# Patient Record
Sex: Male | Born: 1935 | Race: Black or African American | Hispanic: No | Marital: Married | Smoking: Former smoker
Health system: Southern US, Community
[De-identification: ages and names within clinical notes are randomized; demographics above are authoritative.]

## PROBLEM LIST (undated history)

## (undated) DIAGNOSIS — Z8546 Personal history of malignant neoplasm of prostate: Secondary | ICD-10-CM

## (undated) DIAGNOSIS — N289 Disorder of kidney and ureter, unspecified: Secondary | ICD-10-CM

## (undated) DIAGNOSIS — C801 Malignant (primary) neoplasm, unspecified: Secondary | ICD-10-CM

## (undated) DIAGNOSIS — I10 Essential (primary) hypertension: Secondary | ICD-10-CM

## (undated) DIAGNOSIS — Z87438 Personal history of other diseases of male genital organs: Secondary | ICD-10-CM

## (undated) DIAGNOSIS — B351 Tinea unguium: Secondary | ICD-10-CM

## (undated) DIAGNOSIS — Z8719 Personal history of other diseases of the digestive system: Secondary | ICD-10-CM

## (undated) DIAGNOSIS — F0390 Unspecified dementia without behavioral disturbance: Secondary | ICD-10-CM

## (undated) DIAGNOSIS — I509 Heart failure, unspecified: Secondary | ICD-10-CM

## (undated) DIAGNOSIS — E785 Hyperlipidemia, unspecified: Secondary | ICD-10-CM

## (undated) HISTORY — DX: Tinea unguium: B35.1

---

## 1998-03-02 DIAGNOSIS — Z8719 Personal history of other diseases of the digestive system: Secondary | ICD-10-CM

## 1998-03-02 HISTORY — DX: Personal history of other diseases of the digestive system: Z87.19

## 1998-06-04 ENCOUNTER — Inpatient Hospital Stay (HOSPITAL_COMMUNITY): Admission: AD | Admit: 1998-06-04 | Discharge: 1998-06-07 | Payer: Self-pay | Admitting: Internal Medicine

## 1998-06-06 ENCOUNTER — Encounter: Payer: Self-pay | Admitting: Gastroenterology

## 1998-06-06 ENCOUNTER — Encounter (HOSPITAL_BASED_OUTPATIENT_CLINIC_OR_DEPARTMENT_OTHER): Payer: Self-pay | Admitting: Internal Medicine

## 2001-03-02 DIAGNOSIS — Z8546 Personal history of malignant neoplasm of prostate: Secondary | ICD-10-CM

## 2001-03-02 HISTORY — DX: Personal history of malignant neoplasm of prostate: Z85.46

## 2001-04-24 ENCOUNTER — Ambulatory Visit: Admission: RE | Admit: 2001-04-24 | Discharge: 2001-07-23 | Payer: Self-pay | Admitting: Radiation Oncology

## 2001-04-27 ENCOUNTER — Ambulatory Visit (HOSPITAL_COMMUNITY): Admission: RE | Admit: 2001-04-27 | Discharge: 2001-04-27 | Payer: Self-pay | Admitting: Urology

## 2001-04-27 ENCOUNTER — Encounter: Payer: Self-pay | Admitting: Urology

## 2001-08-02 ENCOUNTER — Ambulatory Visit (HOSPITAL_COMMUNITY): Admission: RE | Admit: 2001-08-02 | Discharge: 2001-08-02 | Payer: Self-pay | Admitting: Urology

## 2001-08-02 ENCOUNTER — Encounter: Payer: Self-pay | Admitting: Urology

## 2001-08-09 ENCOUNTER — Ambulatory Visit: Admission: RE | Admit: 2001-08-09 | Discharge: 2001-11-07 | Payer: Self-pay | Admitting: Radiation Oncology

## 2001-12-05 ENCOUNTER — Ambulatory Visit: Admission: RE | Admit: 2001-12-05 | Discharge: 2002-02-09 | Payer: Self-pay | Admitting: Radiation Oncology

## 2002-03-13 ENCOUNTER — Ambulatory Visit: Admission: RE | Admit: 2002-03-13 | Discharge: 2002-03-13 | Payer: Self-pay | Admitting: Radiation Oncology

## 2003-03-19 ENCOUNTER — Ambulatory Visit: Admission: RE | Admit: 2003-03-19 | Discharge: 2003-03-19 | Payer: Self-pay | Admitting: Radiation Oncology

## 2004-03-27 ENCOUNTER — Ambulatory Visit: Admission: RE | Admit: 2004-03-27 | Discharge: 2004-06-25 | Payer: Self-pay | Admitting: Radiation Oncology

## 2010-06-29 ENCOUNTER — Emergency Department (HOSPITAL_COMMUNITY): Payer: Medicare Other

## 2010-06-29 ENCOUNTER — Inpatient Hospital Stay (HOSPITAL_COMMUNITY)
Admission: EM | Admit: 2010-06-29 | Discharge: 2010-06-30 | DRG: 312 | Disposition: A | Discharge status: Home Health | Payer: Medicare Other | Attending: Internal Medicine | Admitting: Internal Medicine

## 2010-06-29 DIAGNOSIS — Z7982 Long term (current) use of aspirin: Secondary | ICD-10-CM

## 2010-06-29 DIAGNOSIS — N400 Enlarged prostate without lower urinary tract symptoms: Secondary | ICD-10-CM | POA: Diagnosis present

## 2010-06-29 DIAGNOSIS — E875 Hyperkalemia: Secondary | ICD-10-CM | POA: Diagnosis present

## 2010-06-29 DIAGNOSIS — F0390 Unspecified dementia without behavioral disturbance: Secondary | ICD-10-CM | POA: Diagnosis present

## 2010-06-29 DIAGNOSIS — R55 Syncope and collapse: Principal | ICD-10-CM | POA: Diagnosis present

## 2010-06-29 DIAGNOSIS — N183 Chronic kidney disease, stage 3 unspecified: Secondary | ICD-10-CM | POA: Diagnosis present

## 2010-06-29 DIAGNOSIS — I5030 Unspecified diastolic (congestive) heart failure: Secondary | ICD-10-CM | POA: Diagnosis present

## 2010-06-29 DIAGNOSIS — Z8546 Personal history of malignant neoplasm of prostate: Secondary | ICD-10-CM

## 2010-06-29 DIAGNOSIS — I509 Heart failure, unspecified: Secondary | ICD-10-CM | POA: Diagnosis present

## 2010-06-29 LAB — DIFFERENTIAL
Eosinophils Relative: 3 % (ref 0–5)
Lymphocytes Relative: 19 % (ref 12–46)
Lymphs Abs: 1.1 10*3/uL (ref 0.7–4.0)
Monocytes Absolute: 0.5 10*3/uL (ref 0.1–1.0)
Monocytes Relative: 8 % (ref 3–12)

## 2010-06-29 LAB — CARDIAC PANEL(CRET KIN+CKTOT+MB+TROPI)
Relative Index: 1.3 (ref 0.0–2.5)
Troponin I: 0.01 ng/mL (ref 0.00–0.06)

## 2010-06-29 LAB — URINALYSIS, ROUTINE W REFLEX MICROSCOPIC
Hgb urine dipstick: NEGATIVE
Specific Gravity, Urine: 1.021 (ref 1.005–1.030)
Urobilinogen, UA: 1 mg/dL (ref 0.0–1.0)

## 2010-06-29 LAB — POCT I-STAT, CHEM 8
Calcium, Ion: 1.15 mmol/L (ref 1.12–1.32)
Glucose, Bld: 96 mg/dL (ref 70–99)
HCT: 38 % — ABNORMAL LOW (ref 39.0–52.0)
Hemoglobin: 12.9 g/dL — ABNORMAL LOW (ref 13.0–17.0)

## 2010-06-29 LAB — CK TOTAL AND CKMB (NOT AT ARMC): CK, MB: 1.5 ng/mL (ref 0.3–4.0)

## 2010-06-29 LAB — GLUCOSE, CAPILLARY: Glucose-Capillary: 97 mg/dL (ref 70–99)

## 2010-06-29 LAB — BRAIN NATRIURETIC PEPTIDE: Pro B Natriuretic peptide (BNP): <30 pg/mL (ref 0.0–100.0)

## 2010-06-29 LAB — CBC
HCT: 36.5 % — ABNORMAL LOW (ref 39.0–52.0)
MCHC: 32.3 g/dL (ref 30.0–36.0)
MCV: 82 fL (ref 78.0–100.0)
RDW: 18.4 % — ABNORMAL HIGH (ref 11.5–15.5)
WBC: 6.2 10*3/uL (ref 4.0–10.5)

## 2010-06-30 DIAGNOSIS — R55 Syncope and collapse: Secondary | ICD-10-CM

## 2010-06-30 LAB — CBC
HCT: 36.2 % — ABNORMAL LOW (ref 39.0–52.0)
Hemoglobin: 11.7 g/dL — ABNORMAL LOW (ref 13.0–17.0)
MCHC: 32.3 g/dL (ref 30.0–36.0)
RBC: 4.46 MIL/uL (ref 4.22–5.81)
WBC: 5.3 10*3/uL (ref 4.0–10.5)

## 2010-06-30 LAB — BASIC METABOLIC PANEL
CO2: 27 mEq/L (ref 19–32)
Chloride: 104 mEq/L (ref 96–112)
Glucose, Bld: 90 mg/dL (ref 70–99)
Potassium: 4.2 mEq/L (ref 3.5–5.1)
Sodium: 138 mEq/L (ref 135–145)

## 2010-06-30 LAB — CARDIAC PANEL(CRET KIN+CKTOT+MB+TROPI)
CK, MB: 1.4 ng/mL (ref 0.3–4.0)
Relative Index: 1.1 (ref 0.0–2.5)
Relative Index: 1.2 (ref 0.0–2.5)

## 2010-07-01 NOTE — H&P (Signed)
NAME:  Cody [name], Cody [name]             ACCOUNT NO.:  [id number]  MEDICAL RECORD NO.:  [id number]           PATIENT TYPE:  I  LOCATION:  3708                         FACILITY:  [facility]  PHYSICIAN:  Kari Baars, M.D.  DATE OF BIRTH:  September 01, 1927  DATE OF ADMISSION:  06/29/2010 DATE OF DISCHARGE:                             HISTORY & PHYSICAL   CHIEF COMPLAINT:  Lightheadedness.  HISTORY OF PRESENT ILLNESS:  Cody [name] is an 77 year old African American male with a history of dilated cardiomyopathy/congestive heart failure secondary to systolic dysfunction (ejection fraction 30%), hypertension, and hyperlipidemia who presented to the emergency department with a complaint of lightheadedness.  The patient's history may be limited bit by his cognitive impairment, but he reports that he was in his usual state of health this morning.  He ate his breakfast with minimal fluid intake and went to church.  While standing forprayer, he became lightheaded and nearly passed out.  He was helped to a wheelchair by his parishioners and slumped down, but did not have loss of consciousness.  He denied any chest pain or palpitations.  EMS was called and he was brought to the emergency department where evaluation is negative thus far.  He denies any prior history of syncope or near syncope.  He does have an active lifestyle stating that he plays golf frequently and has been doing yard work without any chest pain, shortness of breath, dyspnea on exertion.  He denies orthopnea or edema. He denies any associated neurologic symptoms with this episode or recently.  He does not recall seeing cardiologist in the past.  We did have a record in our chart of an echocardiogram in 2003 by Dr. Donnie Aho that showed an ejection fraction of 30%.  There was no evidence of any cardiac followup since that time.  REVIEW OF SYSTEMS:  All systems were reviewed with the patient are negative except in HPI.  PAST MEDICAL  HISTORY: 1. Dilated cardiomyopathy/congestive heart failure secondary to     systolic dysfunction (ejection fraction 30-35%) with global     hypokinesis in June 2003. 2. Hypertension. 3. Hyperlipidemia. 4. Chronic renal insufficiency, stage III with baseline creatinine of     around 1.2. 5. BPH. 6. History of prostate cancer (diagnosed in 2003), status post     external radiation therapy. 7. History of lower GI bleed (April 2000). 8. Mild cognitive impairment/dementia.  CURRENT MEDICATIONS: 1. Amlodipine 5 mg daily. 2. Aspirin 81 mg daily. 3. Coreg 25 mg b.i.d. 4. Doxazosin 4 mg daily. 5. Aricept 5 mg nightly. 6. Ferrex 150 mg daily.  ALLERGIES: 1. LIPITOR caused myalgias in the past. 2. ACE INHIBITOR caused hyperkalemia.  SOCIAL HISTORY:  He is married, has no children.  He is retired from [employer].  He is a remote smoker of less than four-pack year history.  He quit in the 1970s.  Denies alcohol use.  FAMILY HISTORY:  Father died at the age of 14 due to complications from appendicitis.  Mother died at multiple myeloma at 18 and he has a brother who had prostate cancer.  PHYSICAL EXAMINATION:  VITAL SIGNS:  Temperature 97.7, blood  pressure lying 115/64 with a heart rate of 60, sitting 118/79 with a pulse of 66, and standing 102/58 with a pulse of 73, oxygen saturation 99% on room air. GENERAL:  Articulate African American gentleman with some apparent memory loss. HEENT:  Oropharynx is moist.  No scleral icterus. NECK:  Supple without lymphadenopathy, JVD, or carotid bruits. HEART:  Regular rate and rhythm without murmurs, rubs, or gallops.  I do not appreciate an S3 or S4. LUNGS:  Clear to auscultation bilaterally. ABDOMEN:  Soft, nondistended, nontender with normoactive bowel sounds. EXTREMITIES:  No clubbing, cyanosis or edema. NEUROLOGIC:  Alert and oriented to situation and place.  Motor strength is 5/5 in all extremities.  No finger-to-nose ataxia.  No  focal neurologic deficits.  LABORATORY STUDIES:  CBC shows a white count of 6.2, hemoglobin 11.8, platelets 652.  BMET significant for sodium 139, potassium 4.9, chloride 103, bicarb 26, BUN 17, creatinine 1.5, glucose 96.  Urinalysis is negative.  Cardiac enzymes are negative x1 with a troponin of less than 0.05.  STUDIES: 1. EKG personally reviewed shows normal sinus rhythm with normal EKG. 2. Chest x-ray shows no acute cardiopulmonary disease (personally     reviewed).  ASSESSMENT/PLAN: 1. Near syncope - he will be admitted for further evaluation and     treatment.  He was orthostatic upon standing, which may be the     culprit and maybe related to his doxazosin use and mild volume     depletion as evidenced by his elevated BUN and creatinine; however,     given his prior dilated cardiomyopathy with the depressed ejection     fraction (30% in 2003).  He will need a telemetry monitoring for     any associated arrhythmia and a repeat echocardiogram.  We will     rule out myocardial infarction with serial enzymes.  Consider     Cardiology consult pending echo results.  We will obtain carotid     Dopplers and transcranial Dopplers as well.  We will repeat his     orthostasis in the morning after some gentle hydration overnight. 2. Congestive heart failure secondary to diastolic dysfunction     (ejection fraction 30% in 2003).  We will continue his beta-blocker     therapy.  He is not a candidate for an ACE inhibitor or ARB     secondary to his prior history of hyperkalemia.  He appears well     compensated at this time.  We will obtain a BNP. 3. Chronic renal insufficiency, stage III - his baseline creatinine is     around 1.2 in August 2011.  It is 1.5 today.  We will monitor with     hydration as he may be slightly volume depleted. 4. Benign prostatic hypertrophy/history of prostate cancer - we will     hold his doxazosin for now given his     orthostasis.  We will defer to Dr.  Eloise Harman whether it is best to     resume this given his prior history. 5. Disposition - anticipate possible discharge in 1-2 days pending     workup. 6. Deep venous thrombosis prophylaxis with Lovenox.     Kari Baars, M.D.     WS/MEDQ  D:  06/29/2010  T:  06/30/2010  Job:  [id number]  cc:   Barry Dienes. Eloise Harman, M.D. Georga Hacking, M.D.  Electronically Signed by Lacretia Nicks. Buren Kos M.D. on 07/01/2010 09:11:43 PM

## 2010-07-16 NOTE — Discharge Summary (Signed)
NAME:  Cody [name], Cody [name]             ACCOUNT NO.:  [id number]  MEDICAL RECORD NO.:  [id number]           PATIENT TYPE:  I  LOCATION:  3708                         FACILITY:  [facility]  PHYSICIAN:  Barry Dienes. Eloise Harman, M.D.DATE OF BIRTH:  1927/10/23  DATE OF ADMISSION:  06/29/2010 DATE OF DISCHARGE:  06/30/2010                              DISCHARGE SUMMARY   PERTINENT FINDINGS:  The patient is an 77 year old African-American man with a history of dilated cardiomyopathy and congestive heart failure secondary to idiopathic systolic dysfunction with a left ventricular ejection fraction of approximately 30%.  He also has a history of hypertension and hyperlipidemia and presented to the Emergency Department with a complaint of lightheadedness.  His history details were somewhat limited because of Alzheimer's disease.  He reports that he was in his usual state of good health on the morning of admission. He ate breakfast and went to church.  While standing for prayer, he became lightheaded and nearly passed out.  He was helped to a wheelchair by his parishioners and slumped down, but did not actually have a loss of consciousness.  He denied associated chest pain or palpitations.  9-1- 1 was called and he was brought to the emergency room.  He has no history of syncope or near-syncope.  He has no history of orthopnea or dyspnea on exertion.  He also denied associated neurologic symptoms with this near-syncope or incontinence.  PAST MEDICAL HISTORY:  Idiopathic dilated cardiomyopathy with June 2003 left ventricular ejection fraction of 30% to 35% and a Cardiolite exercise test not showing evidence of ischemia.  Hypertension, hyperlipidemia.  Chronic renal insufficiency, stage 3, with baseline serum creatinine level of 1.2.  Benign prostatic hypertrophy.  Prostate cancer diagnosed in 2003 status post external beam therapy.  Lower GI bleed in April 2000 and mild Alzheimer's disease.  MEDICATIONS  ON ADMISSION: 1. Amlodipine 5 mg p.o. daily. 2. Aspirin 81 mg p.o. daily. 3. Coreg 25 mg p.o. twice daily. 4. Doxazosin 4 mg p.o. daily. 5. Aricept 5 mg p.o. every p.m. 6. Ferrex 150 mg p.o. daily.  ALLERGIES:  LIPITOR has been associated with myalgias and ACE INHIBITORS have caused hyperkalemia.  SOCIAL HISTORY:  He is married and has no children.  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  He has a remote smoking history of less than four pack years.  This quit in the 1970s and he has no history of alcohol abuse.  INITIAL PHYSICAL EXAMINATION:  VITAL SIGNS:  Blood pressure 115/64 with a pulse of 60, respirations 20, pulse oxygen saturation 99% on room air and temperature 97.7. GENERAL:  The patient is an elderly African-American man who is in no apparent distress. HEAD, EYES, EARS, NOSE AND THROAT:  Examination was within normal limits. NECK:  Without jugular venous distention or carotid bruit. CHEST:  Clear to auscultation. HEART:  Regular rate and rhythm without significant murmur or gallop. ABDOMEN:  Benign. EXTREMITIES:  Without cyanosis, clubbing, or edema. NEUROLOGIC EXAMINATION:  The patient was alert and oriented to situation and place.  Motor strength was 5/5 in all extremities.  He had normal bilateral finger-to-nose testing and  normal muscle strength.  INITIAL LABORATORY STUDIES:  White blood cell count was 6.2 with a hemoglobin of 11.8, platelets 652.  Serum sodium was 139, potassium 4.9, chloride 103, carbon dioxide 26, BUN 17, creatinine 1.5, glucose 96. Urinalysis was normal.  Cardiac enzymes were normal.  RADIOLOGIC: 1. EKG showed the following:     a.     Normal sinus rhythm.     b.     No abnormalities. 2. A chest x-ray showed no acute cardiopulmonary disease.  HOSPITAL COURSE:  As the patient was slightly orthostatic on admission, he was treated with IV fluids and he had continuous telemetry.  His doxazosin was put on hold as it could  contribute to hypotension and near- fainting.  He had a carotid ultrasound examination that showed no evidence of internal carotid artery stenosis and antegrade vertebral artery flow.  For some reason an echocardiogram was not done.  The patient returned to his room from his carotid ultrasound exam, pulled out his IV, and requested to go home.  PROCEDURES:  Carotid ultrasound examination and EKG telemetry.  COMPLICATIONS:  None.  CONDITION ON DISCHARGE:  His vital signs were stable and he was afebrile.  He was able to walk without any difficulty.  He was not having any shortness of breath or chest discomfort or palpitations.  PHYSICAL EXAMINATION ON THE DAY OF HOSPITAL DISCHARGE:  VITAL SIGNS: Blood pressure 132/71, pulse 65, respirations 18, temperature 97.7, pulse oxygen saturation was 97% on room air.  GENERAL:  He is an elderly black man who was in no apparent distress. CHEST:  Clear to auscultation. HEART:  Regular rate and rhythm. ABDOMEN:  Benign. EXTREMITIES:  Without edema. NEUROLOGIC:  He was alert and could give a good interval history.  He was able to walk without difficulty.  LABORATORY DATA:  Most recent laboratory studies included a white blood cell count of 5.3, hemoglobin 11.7, hematocrit 36, platelets 698, BNP 30.  DISCHARGE DIAGNOSES: 1. Near-syncope. 2. Idiopathic dilated cardiomyopathy. 3. Hypertension, essential, controlled. 4. Benign prostatic hypertrophy. 5. History of prostate adenocarcinoma. 6. Thrombocytosis. 7. Mild Alzheimer's disease.  DISCHARGE MEDICATIONS: 1. Amlodipine 5 mg p.o. daily. 2. Aspirin 81 mg p.o. daily. 3. Carvedilol 25 mg p.o. twice daily. 4. Donepezil 5 mg p.o. every p.m. 5. Ferrex 150 mg p.o. daily. 6. He was advised to discontinue Cardura.  FOLLOWUP PLANS:  He was advised to call Dr. Jarome Matin at [phone] to schedule a followup visit within the next two weeks.          ______________________________ Barry Dienes  Eloise Harman, M.D.     DGP/MEDQ  D:  07/15/2010  T:  07/15/2010  Job:  [id number]  Electronically Signed by Jarome Matin M.D. on 07/16/2010 08:19:33 AM

## 2010-11-03 ENCOUNTER — Other Ambulatory Visit: Payer: Self-pay | Admitting: Gastroenterology

## 2012-04-21 ENCOUNTER — Emergency Department (HOSPITAL_COMMUNITY)
Admission: EM | Admit: 2012-04-21 | Discharge: 2012-04-21 | Disposition: A | Discharge status: Home / Self Care | Payer: Medicare Other | Attending: Emergency Medicine | Admitting: Emergency Medicine

## 2012-04-21 ENCOUNTER — Encounter (HOSPITAL_COMMUNITY): Payer: Self-pay | Admitting: Emergency Medicine

## 2012-04-21 DIAGNOSIS — F0280 Dementia in other diseases classified elsewhere without behavioral disturbance: Secondary | ICD-10-CM | POA: Insufficient documentation

## 2012-04-21 DIAGNOSIS — Y9210 Unspecified residential institution as the place of occurrence of the external cause: Secondary | ICD-10-CM | POA: Insufficient documentation

## 2012-04-21 DIAGNOSIS — Z043 Encounter for examination and observation following other accident: Secondary | ICD-10-CM | POA: Insufficient documentation

## 2012-04-21 DIAGNOSIS — G309 Alzheimer's disease, unspecified: Secondary | ICD-10-CM | POA: Insufficient documentation

## 2012-04-21 DIAGNOSIS — W19XXXA Unspecified fall, initial encounter: Secondary | ICD-10-CM

## 2012-04-21 DIAGNOSIS — Y939 Activity, unspecified: Secondary | ICD-10-CM | POA: Insufficient documentation

## 2012-04-21 DIAGNOSIS — Z79899 Other long term (current) drug therapy: Secondary | ICD-10-CM | POA: Insufficient documentation

## 2012-04-21 DIAGNOSIS — W07XXXA Fall from chair, initial encounter: Secondary | ICD-10-CM | POA: Insufficient documentation

## 2012-04-21 HISTORY — DX: Unspecified dementia, unspecified severity, without behavioral disturbance, psychotic disturbance, mood disturbance, and anxiety: F03.90

## 2012-04-21 NOTE — ED Provider Notes (Signed)
Elderly black male who is extremely active and trying to get off the stretcher. He does not appear to have any pain, he is using arms and legs well without difficulty.   Medical screening examination/treatment/procedure(s) were conducted as a shared visit with non-physician practitioner(s) and myself.  I personally evaluated the patient during the encounter  Devoria Albe, MD, Franz Dell, MD 04/21/12 [phone]

## 2012-04-21 NOTE — ED Provider Notes (Signed)
See prior note   Ward Givens, MD 04/21/12 7846

## 2012-04-21 NOTE — ED Notes (Signed)
Per EMS pt at nursing home facility when he fell asleep in chair and fell on left side. Pt denies any pain. No obvious injuries.

## 2012-04-21 NOTE — ED Provider Notes (Signed)
History     CSN: [id number]  Arrival date & time 04/21/12  1454   First MD Initiated Contact with Patient 04/21/12 1521      Chief Complaint  Patient presents with  . Fall    (Consider location/radiation/quality/duration/timing/severity/associated sxs/prior treatment) HPI Patient presents from a nursing home following a fall occurred just prior to arrival.  The patient is unable to give me any history do to Alzheimer's and dementia.  Patient does not have any signs of major trauma. Past Medical History  Diagnosis Date  . Dementia     No past surgical history on file.  No family history on file.  History  Substance Use Topics  . Smoking status: Not on file  . Smokeless tobacco: Not on file  . Alcohol Use: Not on file      Review of Systems Level V caveat due to dementia and Alzheimer Allergies  Review of patient's allergies indicates not on file.  Home Medications   Current Outpatient Rx  Name  Route  Sig  Dispense  Refill  . carvedilol (COREG) 25 MG tablet   Oral   Take 25 mg by mouth 2 (two) times daily with a meal.         . divalproex (DEPAKOTE) 125 MG DR tablet   Oral   Take 125 mg by mouth 2 (two) times daily.         . divalproex (DEPAKOTE) 125 MG DR tablet   Oral   Take 375 mg by mouth at bedtime.         . donepezil (ARICEPT) 5 MG tablet   Oral   Take 5 mg by mouth at bedtime as needed.         Marland Kitchen OLANZapine (ZYPREXA) 5 MG tablet   Oral   Take 5 mg by mouth daily.         . traZODone (DESYREL) 50 MG tablet   Oral   Take 50 mg by mouth at bedtime.         Marland Kitchen zolpidem (AMBIEN) 5 MG tablet   Oral   Take 5 mg by mouth at bedtime as needed for sleep.           BP 144/80  Pulse 86  Temp(Src) 98.2 F (36.8 C) (Oral)  Resp 18  SpO2 98%  Physical Exam  Constitutional: He appears well-developed and well-nourished. No distress.  HENT:  Head: Normocephalic and atraumatic.  Mouth/Throat: Oropharynx is clear and moist.  Eyes:  Pupils are equal, round, and reactive to light.  Neck: Normal range of motion. Neck supple.  Cardiovascular: Normal rate, regular rhythm and normal heart sounds.   Pulmonary/Chest: Effort normal and breath sounds normal.  Musculoskeletal:  Patient does not have any pain on my exam of all 4 extremities.  Patient is moving all 4 extremities without difficulty and is attempting to get out of bed  Neurological: He is alert. Coordination normal.  Skin: Skin is warm and dry.    ED Course  Procedures (including critical care time)  Patient does not have any signs of trauma or significant injury on exam.  We'll have patient followup with his primary Dr. for recheck.  Told to return to the emergency department for any worsening in his condition   MDM  MDM Reviewed: nursing note and vitals           Carlyle Dolly, PA-C 04/21/12 1737

## 2012-04-26 ENCOUNTER — Emergency Department (HOSPITAL_COMMUNITY)
Admission: EM | Admit: 2012-04-26 | Discharge: 2012-04-26 | Disposition: A | Discharge status: Skilled Nursing Facility | Payer: Medicare Other | Attending: Emergency Medicine | Admitting: Emergency Medicine

## 2012-04-26 ENCOUNTER — Emergency Department (HOSPITAL_COMMUNITY): Payer: Medicare Other

## 2012-04-26 DIAGNOSIS — Y9210 Unspecified residential institution as the place of occurrence of the external cause: Secondary | ICD-10-CM | POA: Insufficient documentation

## 2012-04-26 DIAGNOSIS — S46909A Unspecified injury of unspecified muscle, fascia and tendon at shoulder and upper arm level, unspecified arm, initial encounter: Secondary | ICD-10-CM | POA: Insufficient documentation

## 2012-04-26 DIAGNOSIS — Y939 Activity, unspecified: Secondary | ICD-10-CM | POA: Insufficient documentation

## 2012-04-26 DIAGNOSIS — F0390 Unspecified dementia without behavioral disturbance: Secondary | ICD-10-CM | POA: Insufficient documentation

## 2012-04-26 DIAGNOSIS — Z79899 Other long term (current) drug therapy: Secondary | ICD-10-CM | POA: Insufficient documentation

## 2012-04-26 DIAGNOSIS — W19XXXA Unspecified fall, initial encounter: Secondary | ICD-10-CM | POA: Insufficient documentation

## 2012-04-26 NOTE — ED Provider Notes (Addendum)
History     CSN: [id number]  Arrival date & time 04/26/12  1341   First MD Initiated Contact with Patient 04/26/12 1411      Chief Complaint  Patient presents with  . Fall   Level V caveat for dementia  (Consider location/radiation/quality/duration/timing/severity/associated sxs/prior treatment) HPI  Patient lives in a nursing home. Evidently he fell today at an per their staff landed on his right side. They report he was complaining of pain in his right shoulder. Patient denies any pain to me however. Patient's sister and wife appeared during my exam. Patient does appear to recognize his wife.   PCP Dr Nyra Capes  Past Medical History  Diagnosis Date  . Dementia     No past surgical history on file.  No family history on file.  History  Substance Use Topics  . Smoking status: No  . Smokeless tobacco: Not on file  . Alcohol Use: No   Lives in a nursing home   Review of Systems  Unable to perform ROS: Dementia    Allergies  Ace inhibitors  Home Medications   Current Outpatient Rx  Name  Route  Sig  Dispense  Refill  . carvedilol (COREG) 25 MG tablet   Oral   Take 25 mg by mouth 2 (two) times daily with a meal.         . clonazePAM (KLONOPIN) 0.5 MG tablet   Oral   Take 0.5 mg by mouth 2 (two) times daily as needed for anxiety.         . divalproex (DEPAKOTE) 125 MG DR tablet   Oral   Take 125 mg by mouth every morning.          . divalproex (DEPAKOTE) 125 MG DR tablet   Oral   Take 375 mg by mouth at bedtime.         . donepezil (ARICEPT) 10 MG tablet   Oral   Take 10 mg by mouth at bedtime.         . temazepam (RESTORIL) 15 MG capsule   Oral   Take 30 mg by mouth at bedtime.         Marland Kitchen zolpidem (AMBIEN) 5 MG tablet   Oral   Take 5 mg by mouth at bedtime as needed for sleep.           BP 142/74  Pulse 69  Temp(Src) 98.3 F (36.8 C) (Oral)  Resp 18  SpO2 100%  Vital signs normal    Physical Exam  Nursing note and  vitals reviewed. Constitutional: He is oriented to person, place, and time. He appears well-developed and well-nourished.  Non-toxic appearance. He does not appear ill. No distress.  HENT:  Head: Normocephalic and atraumatic.  Right Ear: External ear normal.  Left Ear: External ear normal.  Nose: Nose normal. No mucosal edema or rhinorrhea.  Mouth/Throat: Oropharynx is clear and moist and mucous membranes are normal. No dental abscesses or edematous.  Head nontender to palpation, no evidence of trauma seen  Eyes: Conjunctivae and EOM are normal. Pupils are equal, round, and reactive to light.  Neck: Normal range of motion and full passive range of motion without pain. Neck supple.  Cardiovascular: Normal rate, regular rhythm and normal heart sounds.  Exam reveals no gallop and no friction rub.   No murmur heard. Pulmonary/Chest: Effort normal and breath sounds normal. No respiratory distress. He has no wheezes. He has no rhonchi. He has no rales. He exhibits no  tenderness and no crepitus.  Ribs are nontender  Abdominal: Soft. Normal appearance and bowel sounds are normal. He exhibits no distension. There is no tenderness. There is no rebound and no guarding.  Musculoskeletal: Normal range of motion. He exhibits no edema and no tenderness.  Moves all extremities well. Patient moves his right upper arm without any apparent difficulty. There is no deformity, there is no tenderness to palpation or tenderness to the clavicle. He also moves his left upper arm freely. He flexes his left knee well without apparent pain in his knee or his hip. He seems to be reluctant to flex his right knee and seems to have some discomfort in his right hip.  Neurological: He is alert and oriented to person, place, and time. He has normal strength. No cranial nerve deficit.  Skin: Skin is warm, dry and intact. No rash noted. No erythema. No pallor.  Psychiatric: He has a normal mood and affect. His speech is normal and  behavior is normal. His mood appears not anxious.    ED Course  Procedures (including critical care time)  Family want a head CT to be done. Pt has no evidence of injury to his head and the NH was not concerned about hitting his head.   I tended to help nursing staff stand patient up. He got angry because he said my hands were cold. He said he had pain but could not tell me where his pain was. I will order CT scan of his hip to make sure there is no occult fracture.   Pt was able to get up and walk with assistance and no complaints of pain. Will cancel CT scan and discharge.   Dg Shoulder Right  04/26/2012  *RADIOLOGY REPORT*  Clinical Data: Pain post trauma  RIGHT SHOULDER - 2+ VIEW  Comparison: None.  Findings: Internal rotation, external rotation, and Y scapular views were obtained.    There is no appreciable fracture or dislocation.  There is mild generalized osteoarthritic change.  No erosive change or intra-articular calcifications.  IMPRESSION: Osteoarthritic change.  No fracture or dislocation.   Original Report Authenticated By: Bretta Bang, M.D.    Dg Hip Complete Right  04/26/2012  *RADIOLOGY REPORT*  Clinical Data: Fall with right hip pain.  RIGHT HIP - COMPLETE 2+ VIEW  Comparison: None.  Findings: There is no evidence of acute fracture or dislocation. Mild degenerative changes are present in the right hip joint. There is some irregularity involving the inferior pubic ramus which, based on appearance, may be related to an old injury. Correlation suggested with any focal tenderness as there may be a subtle fracture.  IMPRESSION: No evidence of acute fracture.  Irregularity along the undersurface of the inferior pubic ramus may relate to prior injury or subtle acute fracture.   Original Report Authenticated By: Irish Lack, M.D.      1. Fall, initial encounter     Plan discharge  Devoria Albe, MD, Concha Pyo, MD, FACEP   MDM          Ward Givens,  MD 04/26/12 1610  Ward Givens, MD 04/26/12 1622

## 2012-04-26 NOTE — ED Notes (Signed)
Pt ambulated 40 feet with assistance

## 2012-04-26 NOTE — ED Notes (Signed)
ZOX:[id number]<AV> Expected date:04/26/12<BR> Expected time: 1:30 PM<BR> Means of arrival:Ambulance<BR> Comments:<BR> 69yoM/fall

## 2012-04-26 NOTE — ED Notes (Signed)
Per EMS [facility], per EMS staff reports pt fell from standing position, landing on his bottom and then rolling on his right side. No LOC, pt c/o right shoulder pain only.

## 2012-04-26 NOTE — Progress Notes (Signed)
ED CM called by male family member to pt room to assist when pt attempting to put on shoes and get out of bed. CM and CN assisted in getting pt back to bed and changed soiled depends.  Family spoke with Cm about pt's dementia.  Family inquired about CT scan mentioned by POA, niece. Dr Lynelle Doctor spoke with family about imaging ordered pcp is daniel paterson EP[facility] updated

## 2012-04-26 NOTE — ED Notes (Signed)
Pt comes to ed with c/o fall, pt reports pain in his right shoulder. Pt has hx of dementia.

## 2012-05-31 ENCOUNTER — Emergency Department (HOSPITAL_COMMUNITY): Payer: Medicare Other

## 2012-05-31 ENCOUNTER — Encounter (HOSPITAL_COMMUNITY): Payer: Self-pay | Admitting: *Deleted

## 2012-05-31 ENCOUNTER — Emergency Department (HOSPITAL_COMMUNITY)
Admission: EM | Admit: 2012-05-31 | Discharge: 2012-05-31 | Disposition: A | Discharge status: Skilled Nursing Facility | Payer: Medicare Other | Attending: Emergency Medicine | Admitting: Emergency Medicine

## 2012-05-31 DIAGNOSIS — F0390 Unspecified dementia without behavioral disturbance: Secondary | ICD-10-CM | POA: Insufficient documentation

## 2012-05-31 DIAGNOSIS — IMO0002 Reserved for concepts with insufficient information to code with codable children: Secondary | ICD-10-CM | POA: Insufficient documentation

## 2012-05-31 DIAGNOSIS — S0990XA Unspecified injury of head, initial encounter: Secondary | ICD-10-CM

## 2012-05-31 DIAGNOSIS — Y9210 Unspecified residential institution as the place of occurrence of the external cause: Secondary | ICD-10-CM | POA: Insufficient documentation

## 2012-05-31 DIAGNOSIS — Z79899 Other long term (current) drug therapy: Secondary | ICD-10-CM | POA: Insufficient documentation

## 2012-05-31 DIAGNOSIS — Z859 Personal history of malignant neoplasm, unspecified: Secondary | ICD-10-CM | POA: Insufficient documentation

## 2012-05-31 DIAGNOSIS — I10 Essential (primary) hypertension: Secondary | ICD-10-CM | POA: Insufficient documentation

## 2012-05-31 DIAGNOSIS — W19XXXA Unspecified fall, initial encounter: Secondary | ICD-10-CM

## 2012-05-31 DIAGNOSIS — Y939 Activity, unspecified: Secondary | ICD-10-CM | POA: Insufficient documentation

## 2012-05-31 DIAGNOSIS — Z87448 Personal history of other diseases of urinary system: Secondary | ICD-10-CM | POA: Insufficient documentation

## 2012-05-31 HISTORY — DX: Disorder of kidney and ureter, unspecified: N28.9

## 2012-05-31 HISTORY — DX: Essential (primary) hypertension: I10

## 2012-05-31 HISTORY — DX: Malignant (primary) neoplasm, unspecified: C80.1

## 2012-05-31 NOTE — ED Notes (Signed)
[facility] called for pt to be transported.

## 2012-05-31 NOTE — ED Notes (Addendum)
EMS called to [facility].  Found patient sitting on side of bed.   Patient has had an unconfirmed un witnessed fall.  Patient has limited verbalization due to dementia Noted abrasion to right forehead.  Patient does not express any pain.

## 2012-06-01 NOTE — ED Provider Notes (Signed)
History     CSN: [id number]  Arrival date & time 05/31/12  0417   First MD Initiated Contact with Patient 05/31/12 0435      Chief Complaint  Patient presents with  . Fall  . Head Injury    abrasion    (Consider location/radiation/quality/duration/timing/severity/associated sxs/prior treatment) The history is provided by the EMS personnel and the nursing home. History limited by: Level V caveat: Dementia.   patient presents from the dementia unit of a nursing home where he was found sitting on the side of the bed.  He was noted to have a small abrasion.  This was an unwitnessed suspected fall.  Staff reports is otherwise baseline mental status without significant changes.  As far as they're concerned he was in his normal state of health over the past several days.  Family reports baseline mental status this time.  The patient reports no pain.  Past Medical History  Diagnosis Date  . Dementia   . Renal disorder   . Hypertension   . Cancer     unknown    History reviewed. No pertinent past surgical history.  History reviewed. No pertinent family history.  History  Substance Use Topics  . Smoking status: Not on file  . Smokeless tobacco: Not on file  . Alcohol Use: Not on file      Review of Systems  Unable to perform ROS: Dementia    Allergies  Ace inhibitors and Lipitor  Home Medications   Current Outpatient Rx  Name  Route  Sig  Dispense  Refill  . acetaminophen (TYLENOL) 325 MG tablet   Oral   Take 650 mg by mouth 2 (two) times daily.         . carvedilol (COREG) 6.25 MG tablet   Oral   Take 6.25 mg by mouth 2 (two) times daily with a meal.         . clonazePAM (KLONOPIN) 0.5 MG tablet   Oral   Take 0.5 mg by mouth at bedtime.         . divalproex (DEPAKOTE SPRINKLE) 125 MG capsule   Oral   Take 125-375 mg by mouth daily. Takes 125mg  in the morning and 375mg  in the evening         . donepezil (ARICEPT) 10 MG tablet   Oral   Take 10 mg by  mouth at bedtime.         Marland Kitchen HYDROcodone-acetaminophen (NORCO/VICODIN) 5-325 MG per tablet   Oral   Take 1 tablet by mouth 2 (two) times daily as needed for pain.         Marland Kitchen sennosides-docusate sodium (SENOKOT-S) 8.6-50 MG tablet   Oral   Take 1 tablet by mouth daily.         . temazepam (RESTORIL) 15 MG capsule   Oral   Take 30 mg by mouth at bedtime.           BP 182/148  Pulse 82  Temp(Src) 97.6 F (36.4 C) (Oral)  Resp 15  SpO2 100%  Physical Exam  Nursing note and vitals reviewed. Constitutional: He is oriented to person, place, and time. He appears well-developed and well-nourished.  HENT:  Head: Normocephalic and atraumatic.  Abrasion to forehead and right parietal scalp without active bleeding.  No lacerations noted.  Small hematoma.  Eyes: EOM are normal.  Neck: Neck supple.  Mild paracervical tenderness without cervical step-offs  Cardiovascular: Normal rate, regular rhythm, normal heart sounds and intact distal pulses.  Pulmonary/Chest: Effort normal and breath sounds normal. No respiratory distress.  Abdominal: Soft. He exhibits no distension. There is no tenderness.  Musculoskeletal: Normal range of motion.  Neurological: He is alert and oriented to person, place, and time.  Skin: Skin is warm and dry.  Psychiatric: He has a normal mood and affect. Judgment normal.    ED Course  Procedures (including critical care time)  Labs Reviewed - No data to display Ct Head Wo Contrast  05/31/2012  *RADIOLOGY REPORT*  Clinical Data:  Unwitnessed fall with abrasions to the right forehead.  CT HEAD WITHOUT CONTRAST CT CERVICAL SPINE WITHOUT CONTRAST  Technique:  Multidetector CT imaging of the head and cervical spine was performed following the standard protocol without intravenous contrast.  Multiplanar CT image reconstructions of the cervical spine were also generated.  Comparison:   None  CT HEAD  Findings: Diffuse cerebral atrophy.  Mild ventricular dilatation  consistent with central atrophy.  Low attenuation changes in the deep white matter consistent with small vessel ischemia.  No mass effect or midline shift.  No abnormal extra-axial fluid collections.  The gray-white matter junctions are distinct.  The basal cisterns are not effaced.  No evidence of acute intracranial hemorrhage.  No depressed skull fractures.  Vascular calcifications.  Visualized paranasal sinuses and mastoid air cells are not opacified.  IMPRESSION: No acute intracranial abnormalities.  Chronic-appearing atrophy and small vessel ischemic changes.  CT CERVICAL SPINE  Findings: Normal alignment of the cervical vertebrae and facet joints.  Degenerative changes throughout the cervical spine with narrowed cervical interspaces and prominent anterior and posterior endplate hypertrophic changes.  Prominent bony hypertrophic at C6-7 cause effacement of anterior thecal sac.  No vertebral compression deformities.  No prevertebral soft tissue swelling.  Lateral masses of C1 appear symmetrical.  The odontoid process appears intact.  No focal bone lesion or bone destruction.  Bone cortex and trabecular architecture appear intact.  There is a lipoma in the subcutaneous soft tissues posterior to the spinous processes of C5-T1.  This measures about 2.4 x 4.38 by 4.1 cm.  IMPRESSION: No displaced cervical spine fractures identified.  Diffuse degenerative changes.  Lipoma in the posterior soft tissues.   Original Report Authenticated By: Burman Nieves, M.D.    Ct Cervical Spine Wo Contrast  05/31/2012  *RADIOLOGY REPORT*  Clinical Data:  Unwitnessed fall with abrasions to the right forehead.  CT HEAD WITHOUT CONTRAST CT CERVICAL SPINE WITHOUT CONTRAST  Technique:  Multidetector CT imaging of the head and cervical spine was performed following the standard protocol without intravenous contrast.  Multiplanar CT image reconstructions of the cervical spine were also generated.  Comparison:   None  CT HEAD  Findings:  Diffuse cerebral atrophy.  Mild ventricular dilatation consistent with central atrophy.  Low attenuation changes in the deep white matter consistent with small vessel ischemia.  No mass effect or midline shift.  No abnormal extra-axial fluid collections.  The gray-white matter junctions are distinct.  The basal cisterns are not effaced.  No evidence of acute intracranial hemorrhage.  No depressed skull fractures.  Vascular calcifications.  Visualized paranasal sinuses and mastoid air cells are not opacified.  IMPRESSION: No acute intracranial abnormalities.  Chronic-appearing atrophy and small vessel ischemic changes.  CT CERVICAL SPINE  Findings: Normal alignment of the cervical vertebrae and facet joints.  Degenerative changes throughout the cervical spine with narrowed cervical interspaces and prominent anterior and posterior endplate hypertrophic changes.  Prominent bony hypertrophic at C6-7 cause effacement of anterior thecal  sac.  No vertebral compression deformities.  No prevertebral soft tissue swelling.  Lateral masses of C1 appear symmetrical.  The odontoid process appears intact.  No focal bone lesion or bone destruction.  Bone cortex and trabecular architecture appear intact.  There is a lipoma in the subcutaneous soft tissues posterior to the spinous processes of C5-T1.  This measures about 2.4 x 4.38 by 4.1 cm.  IMPRESSION: No displaced cervical spine fractures identified.  Diffuse degenerative changes.  Lipoma in the posterior soft tissues.   Original Report Authenticated By: Burman Nieves, M.D.    I personally reviewed the imaging tests through PACS system I reviewed available ER/hospitalization records through the EMR   1. Head injury, initial encounter   2. Fall, initial encounter       MDM  CT head and CT cervical spine without abnormalities.  Full range of motion bilateral hips and ankles.        Lyanne Co, MD 06/01/12 [phone]

## 2012-07-10 ENCOUNTER — Emergency Department (HOSPITAL_COMMUNITY)
Admission: EM | Admit: 2012-07-10 | Discharge: 2012-07-10 | Disposition: A | Discharge status: Skilled Nursing Facility | Payer: Medicare Other | Attending: Emergency Medicine | Admitting: Emergency Medicine

## 2012-07-10 ENCOUNTER — Emergency Department (HOSPITAL_COMMUNITY): Payer: Medicare Other

## 2012-07-10 DIAGNOSIS — Z859 Personal history of malignant neoplasm, unspecified: Secondary | ICD-10-CM | POA: Insufficient documentation

## 2012-07-10 DIAGNOSIS — Z87448 Personal history of other diseases of urinary system: Secondary | ICD-10-CM | POA: Insufficient documentation

## 2012-07-10 DIAGNOSIS — F0390 Unspecified dementia without behavioral disturbance: Secondary | ICD-10-CM | POA: Insufficient documentation

## 2012-07-10 DIAGNOSIS — I10 Essential (primary) hypertension: Secondary | ICD-10-CM | POA: Insufficient documentation

## 2012-07-10 DIAGNOSIS — Z79899 Other long term (current) drug therapy: Secondary | ICD-10-CM | POA: Insufficient documentation

## 2012-07-10 DIAGNOSIS — N390 Urinary tract infection, site not specified: Secondary | ICD-10-CM

## 2012-07-10 LAB — URINALYSIS, ROUTINE W REFLEX MICROSCOPIC
Ketones, ur: NEGATIVE mg/dL
Nitrite: NEGATIVE
Protein, ur: NEGATIVE mg/dL
Urobilinogen, UA: 1 mg/dL (ref 0.0–1.0)

## 2012-07-10 LAB — CBC WITH DIFFERENTIAL/PLATELET
Basophils Absolute: 0 10*3/uL (ref 0.0–0.1)
Basophils Relative: 1 % (ref 0–1)
MCHC: 32.1 g/dL (ref 30.0–36.0)
Monocytes Absolute: 0.6 10*3/uL (ref 0.1–1.0)
Neutro Abs: 4.1 10*3/uL (ref 1.7–7.7)
Neutrophils Relative %: 64 % (ref 43–77)
RDW: 21 % — ABNORMAL HIGH (ref 11.5–15.5)

## 2012-07-10 LAB — BASIC METABOLIC PANEL
Chloride: 102 mEq/L (ref 96–112)
Creatinine, Ser: 1.35 mg/dL (ref 0.50–1.35)
GFR calc Af Amer: 55 mL/min — ABNORMAL LOW (ref >90)
Potassium: 4.5 mEq/L (ref 3.5–5.1)

## 2012-07-10 LAB — URINE MICROSCOPIC-ADD ON

## 2012-07-10 MED ORDER — CEFTRIAXONE SODIUM 1 G IJ SOLR
1.0000 g | INTRAMUSCULAR | Status: DC
  Administered 2012-07-10: 1 g via INTRAMUSCULAR
  Filled 2012-07-10: qty 10

## 2012-07-10 MED ORDER — NITROFURANTOIN MONOHYD MACRO 100 MG PO CAPS: 100.0000 mg | ORAL_CAPSULE | Freq: Two times a day (BID) | ORAL | Status: DC

## 2012-07-10 NOTE — ED Notes (Signed)
[id number]> Expected date:<BR> Expected time:<BR> Means of arrival:<BR> Comments:<BR> Ems/ combative

## 2012-07-10 NOTE — ED Notes (Signed)
Patient transported to CT 

## 2012-07-10 NOTE — ED Notes (Signed)
Report called to [facility]

## 2012-07-10 NOTE — ED Notes (Signed)
[facility] here to take pt back to [facility].

## 2012-07-10 NOTE — ED Notes (Addendum)
Attempted in and out cath but no urine in bladder. Pt just voided previous to catheter insertion. Purulent drainage noted in tip of catheter after insertion.

## 2012-07-10 NOTE — ED Notes (Addendum)
Pt up to side  of bed and urinated on floor pt combative when helped back to bed.

## 2012-07-10 NOTE — ED Notes (Signed)
Pt has not been taking meds last couple of days also pt has strong odor of urine. Pt combative at nursing home and with EMS. EMS gave 5 of haldol in route. Daughter will be at [facility] soon per ems.

## 2012-07-10 NOTE — ED Notes (Signed)
[facility] called for transport.  

## 2012-07-10 NOTE — ED Provider Notes (Signed)
History     CSN: [id number]  Arrival date & time 07/10/12  1511   First MD Initiated Contact with Patient 07/10/12 1518      No chief complaint on file.   (Consider location/radiation/quality/duration/timing/severity/associated sxs/prior treatment) HPI Comments: Patient comes to the ER for evaluation of mental status changes. Patient was reportedly very agitated and aggressive at the nursing home earlier today. EMS was called and the patient required 5 mg of Haldol to be transported to the ER. EMS reports that he has calmed down a lot. At arrival to the ER, patient is calm, lying in the bed. He will not answer any questions. Patient demented, will answer questions and therefore level V caveat applies.   Past Medical History  Diagnosis Date  . Dementia   . Renal disorder   . Hypertension   . Cancer     unknown    No past surgical history on file.  No family history on file.  History  Substance Use Topics  . Smoking status: Not on file  . Smokeless tobacco: Not on file  . Alcohol Use: Not on file      Review of Systems  Unable to perform ROS   Allergies  Ace inhibitors and Lipitor  Home Medications   Current Outpatient Rx  Name  Route  Sig  Dispense  Refill  . acetaminophen (TYLENOL) 325 MG tablet   Oral   Take 650 mg by mouth 2 (two) times daily.         . carvedilol (COREG) 6.25 MG tablet   Oral   Take 6.25 mg by mouth 2 (two) times daily with a meal.         . clonazePAM (KLONOPIN) 0.5 MG tablet   Oral   Take 0.5 mg by mouth at bedtime.         . donepezil (ARICEPT) 10 MG tablet   Oral   Take 10 mg by mouth at bedtime.         Marland Kitchen HYDROcodone-acetaminophen (NORCO/VICODIN) 5-325 MG per tablet   Oral   Take 1 tablet by mouth 2 (two) times daily as needed for pain.         Marland Kitchen sennosides-docusate sodium (SENOKOT-S) 8.6-50 MG tablet   Oral   Take 1 tablet by mouth daily.         . temazepam (RESTORIL) 15 MG capsule   Oral   Take 30 mg by  mouth at bedtime.           BP 136/84  Pulse 84  Resp 16  Physical Exam  Constitutional: He appears well-developed and well-nourished. No distress.  HENT:  Head: Normocephalic and atraumatic.  Right Ear: Hearing normal.  Left Ear: Hearing normal.  Nose: Nose normal.  Mouth/Throat: Oropharynx is clear and moist and mucous membranes are normal.  Eyes: Conjunctivae and EOM are normal. Pupils are equal, round, and reactive to light.  Neck: Normal range of motion. Neck supple.  Cardiovascular: Regular rhythm, S1 normal and S2 normal.  Exam reveals no gallop and no friction rub.   No murmur heard. Pulmonary/Chest: Effort normal and breath sounds normal. No respiratory distress. He exhibits no tenderness.  Abdominal: Soft. Normal appearance and bowel sounds are normal. There is no hepatosplenomegaly. There is no tenderness. There is no rebound, no guarding, no tenderness at McBurney's point and negative Murphy's sign. No hernia.  Musculoskeletal: Normal range of motion.  Neurological: He is alert. He has normal strength. No cranial nerve deficit or  sensory deficit. Coordination normal. GCS eye subscore is 4. GCS verbal subscore is 5. GCS motor subscore is 6.  Non-communicative, does not follow commands  Skin: Skin is warm, dry and intact. No rash noted. No cyanosis.  Psychiatric: He has a normal mood and affect. His speech is normal and behavior is normal. Thought content normal.    ED Course  Procedures (including critical care time)  Labs Reviewed  URINALYSIS, ROUTINE W REFLEX MICROSCOPIC - Abnormal; Notable for the following:    Leukocytes, UA SMALL (*)    All other components within normal limits  CBC WITH DIFFERENTIAL - Abnormal; Notable for the following:    RBC 4.07 (*)    Hemoglobin 10.8 (*)    HCT 33.6 (*)    RDW 21.0 (*)    Platelets 752 (*)    All other components within normal limits  BASIC METABOLIC PANEL - Abnormal; Notable for the following:    GFR calc non Af  Amer 47 (*)    GFR calc Af Amer 55 (*)    All other components within normal limits  URINE MICROSCOPIC-ADD ON   Ct Head Wo Contrast  07/10/2012  *RADIOLOGY REPORT*  Clinical Data: 79 year old male with altered mental status.  CT HEAD WITHOUT CONTRAST  Technique:  Contiguous axial images were obtained from the base of the skull through the vertex without contrast.  Comparison: 05/31/2012 CT  Findings: Mild generalized cerebral/cerebellar volume loss and chronic small vessel white matter ischemic changes again noted.  No acute intracranial abnormalities are identified, including mass lesion or mass effect, hydrocephalus, extra-axial fluid collection, midline shift, hemorrhage, or acute infarction.  The visualized bony calvarium is unremarkable.  IMPRESSION: No evidence of acute intracranial abnormality.  Mild atrophy and chronic small vessel white matter ischemic changes.   Original Report Authenticated By: Harmon Pier, M.D.      Diagnosis: Mental status changes secondary to UTI and chronic dementia    MDM  Patient comes to the ER for evaluation of change in mental status. Patient reportedly has been more aggressive at the nursing home. EMS reports that he was striking out at them and her trying to transport him. He was given Haldol and had significant improvement. Examination was nonfocal. He does not appear to be in any distress. No concern for sepsis. Workup does reveal evidence of mild urinary tract infection, otherwise workup was normal. Patient administered Rocephin. Is appropriate for return to the nursing center for continued treatment.        Gilda Crease, MD 07/10/12 1932

## 2012-07-26 ENCOUNTER — Emergency Department (HOSPITAL_COMMUNITY)
Admission: EM | Admit: 2012-07-26 | Discharge: 2012-07-26 | Disposition: A | Discharge status: Home / Self Care | Payer: Medicare Other | Attending: Emergency Medicine | Admitting: Emergency Medicine

## 2012-07-26 ENCOUNTER — Encounter (HOSPITAL_COMMUNITY): Payer: Self-pay | Admitting: *Deleted

## 2012-07-26 DIAGNOSIS — Z859 Personal history of malignant neoplasm, unspecified: Secondary | ICD-10-CM | POA: Insufficient documentation

## 2012-07-26 DIAGNOSIS — W010XXA Fall on same level from slipping, tripping and stumbling without subsequent striking against object, initial encounter: Secondary | ICD-10-CM

## 2012-07-26 DIAGNOSIS — Y9389 Activity, other specified: Secondary | ICD-10-CM | POA: Insufficient documentation

## 2012-07-26 DIAGNOSIS — F0390 Unspecified dementia without behavioral disturbance: Secondary | ICD-10-CM

## 2012-07-26 DIAGNOSIS — I10 Essential (primary) hypertension: Secondary | ICD-10-CM | POA: Insufficient documentation

## 2012-07-26 DIAGNOSIS — N289 Disorder of kidney and ureter, unspecified: Secondary | ICD-10-CM | POA: Insufficient documentation

## 2012-07-26 DIAGNOSIS — Z79899 Other long term (current) drug therapy: Secondary | ICD-10-CM | POA: Insufficient documentation

## 2012-07-26 DIAGNOSIS — Y929 Unspecified place or not applicable: Secondary | ICD-10-CM | POA: Insufficient documentation

## 2012-07-26 NOTE — ED Notes (Signed)
[facility] called for transportation back to nursing facility

## 2012-07-26 NOTE — ED Notes (Signed)
Report called to nursing facility.

## 2012-07-26 NOTE — ED Notes (Signed)
Pt continues to try to get out of bed, currently sitting in wheelchair and was offered something to eat

## 2012-07-26 NOTE — ED Notes (Signed)
[id number]> Expected date:<BR> Expected time:<BR> Means of arrival:<BR> Comments:<BR> EMS - Fall

## 2012-07-26 NOTE — ED Provider Notes (Signed)
History     CSN: [id number]  Arrival date & time 07/26/12  1136   First MD Initiated Contact with Patient 07/26/12 1139      Chief Complaint  Patient presents with  . Fall    (Consider location/radiation/quality/duration/timing/severity/associated sxs/prior treatment) Patient is a 79 y.o. male presenting with fall. The history is provided by the patient.  Fall  pt from [facility] via ems. Hx dementia. Pt was found alert, sitting on floor, mental status c/w baseline. No reported loc or alteration of mental status. No fevers. Pt denies pain or other c/o - level 5 caveat  - dementia.     Past Medical History  Diagnosis Date  . Dementia   . Renal disorder   . Hypertension   . Cancer     unknown    History reviewed. No pertinent past surgical history.  History reviewed. No pertinent family history.  History  Substance Use Topics  . Smoking status: Not on file  . Smokeless tobacco: Not on file  . Alcohol Use: Not on file      Review of Systems  Unable to perform ROS: Dementia  level 5 caveat.   Allergies  Ace inhibitors and Lipitor  Home Medications   Current Outpatient Rx  Name  Route  Sig  Dispense  Refill  . acetaminophen (TYLENOL) 325 MG tablet   Oral   Take 325 mg by mouth 2 (two) times daily.          . carvedilol (COREG) 6.25 MG tablet   Oral   Take 6.25 mg by mouth 2 (two) times daily with a meal.         . clonazePAM (KLONOPIN) 0.5 MG tablet   Oral   Take 0.25 mg by mouth 2 (two) times daily.         . divalproex (DEPAKOTE ER) 500 MG 24 hr tablet   Oral   Take 500 mg by mouth at bedtime.         . donepezil (ARICEPT) 10 MG tablet   Oral   Take 10 mg by mouth at bedtime.         Marland Kitchen HYDROcodone-acetaminophen (NORCO/VICODIN) 5-325 MG per tablet   Oral   Take 1 tablet by mouth 2 (two) times daily as needed for pain.         Marland Kitchen sennosides-docusate sodium (SENOKOT-S) 8.6-50 MG tablet   Oral   Take 1 tablet by mouth daily.         .  temazepam (RESTORIL) 15 MG capsule   Oral   Take 30 mg by mouth at bedtime.         . nitrofurantoin, macrocrystal-monohydrate, (MACROBID) 100 MG capsule   Oral   Take 1 capsule (100 mg total) by mouth 2 (two) times daily.   20 capsule   0     There were no vitals taken for this visit.  Physical Exam  Nursing note and vitals reviewed. Constitutional: He is oriented to person, place, and time. He appears well-developed and well-nourished. No distress.  HENT:  Head: Atraumatic.  Mouth/Throat: Oropharynx is clear and moist.  Eyes: Conjunctivae are normal. Pupils are equal, round, and reactive to light.  Neck: Normal range of motion. Neck supple. No tracheal deviation present.  No stiffness or rigidity  Cardiovascular: Normal rate, regular rhythm, normal heart sounds and intact distal pulses.   Pulmonary/Chest: Effort normal and breath sounds normal. No accessory muscle usage. No respiratory distress. He exhibits no tenderness.  Abdominal:  Soft. Bowel sounds are normal. He exhibits no distension and no mass. There is no tenderness. There is no rebound and no guarding.  Genitourinary:  No cva tenderness  Musculoskeletal: Normal range of motion.  CTLS spine, non tender, aligned, no step off.  Good rom bil extremities without pain or focal bony tenderness on exam.    Neurological: He is alert and oriented to person, place, and time.  Motor intact bil. No facial asymmetry or drop.   Skin: Skin is warm and dry.  Psychiatric: He has a normal mood and affect.    ED Course  Procedures (including critical care time)     MDM  Pt alert, content. No c/o. Mental status described as consistent with baseline.  Reviewed nursing notes and prior charts for additional history.   Pt w 2 head cts in past month neg acute.   Pt appears stable for d/c.           Suzi Roots, MD 07/26/12 1208

## 2012-07-26 NOTE — ED Notes (Signed)
[facility] here for transport back to nursing facility

## 2012-07-26 NOTE — ED Notes (Signed)
Pt in by EMS from [name], VS: 120/77 HR 68. Pt was found sitting on floor at nursing home. Staff unsure if pt fell. Pt has hx of falls. EMS reports pts pupils are pinpoint. But denies other complaints.

## 2012-08-09 ENCOUNTER — Other Ambulatory Visit: Payer: Self-pay | Admitting: Internal Medicine

## 2012-08-09 DIAGNOSIS — T1490XA Injury, unspecified, initial encounter: Secondary | ICD-10-CM

## 2012-08-11 ENCOUNTER — Ambulatory Visit
Admission: RE | Admit: 2012-08-11 | Discharge: 2012-08-11 | Disposition: A | Discharge status: Home / Self Care | Payer: PRIVATE HEALTH INSURANCE | Source: Ambulatory Visit | Attending: Internal Medicine | Admitting: Internal Medicine

## 2012-08-11 DIAGNOSIS — T1490XA Injury, unspecified, initial encounter: Secondary | ICD-10-CM

## 2012-09-15 ENCOUNTER — Emergency Department (HOSPITAL_COMMUNITY): Payer: Medicare Other

## 2012-09-15 ENCOUNTER — Emergency Department (HOSPITAL_COMMUNITY)
Admission: EM | Admit: 2012-09-15 | Discharge: 2012-09-16 | Disposition: A | Discharge status: Home / Self Care | Payer: Medicare Other | Attending: Emergency Medicine | Admitting: Emergency Medicine

## 2012-09-15 ENCOUNTER — Encounter (HOSPITAL_COMMUNITY): Payer: Self-pay | Admitting: *Deleted

## 2012-09-15 DIAGNOSIS — R079 Chest pain, unspecified: Secondary | ICD-10-CM | POA: Insufficient documentation

## 2012-09-15 DIAGNOSIS — F0390 Unspecified dementia without behavioral disturbance: Secondary | ICD-10-CM | POA: Insufficient documentation

## 2012-09-15 DIAGNOSIS — I10 Essential (primary) hypertension: Secondary | ICD-10-CM | POA: Insufficient documentation

## 2012-09-15 DIAGNOSIS — Z87448 Personal history of other diseases of urinary system: Secondary | ICD-10-CM | POA: Insufficient documentation

## 2012-09-15 DIAGNOSIS — Z859 Personal history of malignant neoplasm, unspecified: Secondary | ICD-10-CM | POA: Insufficient documentation

## 2012-09-15 DIAGNOSIS — Z79899 Other long term (current) drug therapy: Secondary | ICD-10-CM | POA: Insufficient documentation

## 2012-09-15 LAB — CBC WITH DIFFERENTIAL/PLATELET
Basophils Absolute: 0.1 10*3/uL (ref 0.0–0.1)
Basophils Relative: 1 % (ref 0–1)
Eosinophils Absolute: 0.3 10*3/uL (ref 0.0–0.7)
Lymphs Abs: 1.3 10*3/uL (ref 0.7–4.0)
MCH: 27.3 pg (ref 26.0–34.0)
MCHC: 33.3 g/dL (ref 30.0–36.0)
Neutrophils Relative %: 53 % (ref 43–77)
Platelets: 637 10*3/uL — ABNORMAL HIGH (ref 150–400)
RBC: 4 MIL/uL — ABNORMAL LOW (ref 4.22–5.81)
RDW: 20.6 % — ABNORMAL HIGH (ref 11.5–15.5)

## 2012-09-15 LAB — BASIC METABOLIC PANEL
GFR calc Af Amer: 68 mL/min — ABNORMAL LOW (ref >90)
GFR calc non Af Amer: 59 mL/min — ABNORMAL LOW (ref >90)
Potassium: 4.7 mEq/L (ref 3.5–5.1)
Sodium: 139 mEq/L (ref 135–145)

## 2012-09-15 LAB — TROPONIN I: Troponin I: <0.3 ng/mL (ref <0.30)

## 2012-09-15 MED ORDER — HALOPERIDOL LACTATE 5 MG/ML IJ SOLN
5.0000 mg | Freq: Once | INTRAMUSCULAR | Status: AC
  Administered 2012-09-15: 5 mg via INTRAMUSCULAR
  Filled 2012-09-15: qty 1

## 2012-09-15 MED ORDER — HALOPERIDOL LACTATE 5 MG/ML IJ SOLN
5.0000 mg | Freq: Once | INTRAMUSCULAR | Status: DC
  Filled 2012-09-15: qty 1

## 2012-09-15 NOTE — ED Notes (Signed)
Lab attempted to draw labs without success. Pt will not extend arms for sticks.

## 2012-09-15 NOTE — ED Notes (Signed)
Up in the bed.  More cooperative at this time.

## 2012-09-15 NOTE — ED Notes (Signed)
Dr. Dan Humphreys informed of pt as well. States that she will order more haldol.

## 2012-09-15 NOTE — ED Provider Notes (Signed)
History    CSN: [id number] Arrival date & time 09/15/12  1455  First MD Initiated Contact with Patient 09/15/12 1510     Chief Complaint  Patient presents with  . Chest Pain   (Consider location/radiation/quality/duration/timing/severity/associated sxs/prior Treatment) HPI Comments: Patient with history of dementia, presents to the ED with a chief complaint of chest pain.  He is a difficult historian 2/2 dementia.  When nursing staff asked if he was having chest pain he said no.  When I asked if his chest hurt he said yes.  Nursing home staff state that they saw the patient holding his chest, but he was not complaining of pain at the time.  His mental status seems to be consistent with baseline in reading his previous notes. Level 5 caveat applies.  The history is provided by the patient. No language interpreter was used.   Past Medical History  Diagnosis Date  . Dementia   . Renal disorder   . Hypertension   . Cancer     unknown   History reviewed. No pertinent past surgical history. No family history on file. History  Substance Use Topics  . Smoking status: Not on file  . Smokeless tobacco: Not on file  . Alcohol Use: Not on file    Review of Systems  Unable to perform ROS: Dementia    Allergies  Ace inhibitors and Lipitor  Home Medications   Current Outpatient Rx  Name  Route  Sig  Dispense  Refill  . acetaminophen (TYLENOL) 325 MG tablet   Oral   Take 325 mg by mouth 2 (two) times daily.          . carvedilol (COREG) 6.25 MG tablet   Oral   Take 6.25 mg by mouth 2 (two) times daily with a meal.         . clonazePAM (KLONOPIN) 0.5 MG tablet   Oral   Take 0.25 mg by mouth 2 (two) times daily.         . divalproex (DEPAKOTE ER) 500 MG 24 hr tablet   Oral   Take 500 mg by mouth at bedtime.         . donepezil (ARICEPT) 10 MG tablet   Oral   Take 10 mg by mouth at bedtime.         Marland Kitchen HYDROcodone-acetaminophen (NORCO/VICODIN) 5-325 MG per  tablet   Oral   Take 1 tablet by mouth 2 (two) times daily as needed for pain.         . nitrofurantoin, macrocrystal-monohydrate, (MACROBID) 100 MG capsule   Oral   Take 1 capsule (100 mg total) by mouth 2 (two) times daily.   20 capsule   0   . sennosides-docusate sodium (SENOKOT-S) 8.6-50 MG tablet   Oral   Take 1 tablet by mouth daily.         . temazepam (RESTORIL) 15 MG capsule   Oral   Take 30 mg by mouth at bedtime.          There were no vitals taken for this visit. Physical Exam  Nursing note and vitals reviewed. Constitutional: He is oriented to person, place, and time. He appears well-developed and well-nourished.  HENT:  Head: Normocephalic and atraumatic.  Eyes: Conjunctivae and EOM are normal. Right eye exhibits no discharge. Left eye exhibits no discharge. No scleral icterus.  Neck: Normal range of motion. Neck supple. No JVD present.  Cardiovascular: Normal rate, regular rhythm, normal heart sounds and intact  distal pulses.  Exam reveals no gallop and no friction rub.   No murmur heard. Pulmonary/Chest: Effort normal and breath sounds normal. No respiratory distress. He has no wheezes. He has no rales. He exhibits no tenderness.  Abdominal: Soft. He exhibits no distension and no mass. There is no tenderness. There is no rebound and no guarding.  Musculoskeletal: Normal range of motion. He exhibits no edema and no tenderness.  Neurological: He is alert and oriented to person, place, and time. He has normal reflexes.  Alert to person and city, but doesn't know he is in the hospital.  Skin: Skin is warm and dry.  Psychiatric: His behavior is normal.    ED Course  Procedures (including critical care time) Results for orders placed during the hospital encounter of 09/15/12  CBC WITH DIFFERENTIAL      Result Value Range   WBC 4.8  4.0 - 10.5 K/uL   RBC 4.00 (*) 4.22 - 5.81 MIL/uL   Hemoglobin 10.9 (*) 13.0 - 17.0 g/dL   HCT 16.1 (*) 09.6 - 04.5 %   MCV  81.8  78.0 - 100.0 fL   MCH 27.3  26.0 - 34.0 pg   MCHC 33.3  30.0 - 36.0 g/dL   RDW 40.9 (*) 81.1 - 91.4 %   Platelets 637 (*) 150 - 400 K/uL   Neutrophils Relative % 53  43 - 77 %   Neutro Abs 2.5  1.7 - 7.7 K/uL   Lymphocytes Relative 27  12 - 46 %   Lymphs Abs 1.3  0.7 - 4.0 K/uL   Monocytes Relative 13 (*) 3 - 12 %   Monocytes Absolute 0.6  0.1 - 1.0 K/uL   Eosinophils Relative 6 (*) 0 - 5 %   Eosinophils Absolute 0.3  0.0 - 0.7 K/uL   Basophils Relative 1  0 - 1 %   Basophils Absolute 0.1  0.0 - 0.1 K/uL  BASIC METABOLIC PANEL      Result Value Range   Sodium 139  135 - 145 mEq/L   Potassium 4.7  3.5 - 5.1 mEq/L   Chloride 103  96 - 112 mEq/L   CO2 29  19 - 32 mEq/L   Glucose, Bld 85  70 - 99 mg/dL   BUN 15  6 - 23 mg/dL   Creatinine, Ser 7.82  0.50 - 1.35 mg/dL   Calcium 9.2  8.4 - 95.6 mg/dL   GFR calc non Af Amer 59 (*) >90 mL/min   GFR calc Af Amer 68 (*) >90 mL/min  TROPONIN I      Result Value Range   Troponin I <0.30  <0.30 ng/mL   Dg Chest 1 View  09/15/2012   *RADIOLOGY REPORT*  Clinical Data: Shortness of breath, confusion  CHEST - 1 VIEW  Comparison: Portable chest x-ray of 06/29/2010  Findings: Only a frontal views obtained with the patient refusing the lateral view.  No active infiltrate or effusion is seen. Mediastinal contours are stable.  Mild cardiomegaly is stable.  No acute bony abnormality is seen. The anterior right first rib appears somewhat sclerotic, but unchanged compared to the prior chest x-ray.  IMPRESSION: Stable mild cardiomegaly.  No active lung disease.   Original Report Authenticated By: Dwyane Dee, M.D.     1. Chest pain     ED ECG REPORT  I personally interpreted this EKG   Date: 09/15/2012   Rate: 78  Rhythm: normal sinus rhythm  QRS Axis: normal  Intervals:  normal  ST/T Wave abnormalities: normal  Conduction Disutrbances:none  Narrative Interpretation:   Old EKG Reviewed: unchanged    MDM  Patient with reported chest  pain.  Will check basic labs, EKG, and CXR.  Difficult to obtain adequate history due to dementia.   5:01 PM Dr. Dan Humphreys tells me to give 5 mg Haldol, as the patient is combative.  10:04 PM Patient seen by and discussed with Dr. Dan Humphreys.  Feel that the patient is stable for discharge.  He is at his baseline mentation.  Nursing care staff says that they saw the patient holding his chest, but wasn't complaining of pain.  Troponin is negative.  Workup is unremarkable.  Will discharge to home.  Dr. Dan Humphreys agrees with the plan.   Roxy Horseman, PA-C 09/15/12 2206

## 2012-09-15 NOTE — ED Notes (Signed)
Attempted EKG. Pt pulling off leads and grabbing rns hand. rn tried to explain procedure to pt but pt will not cooperate.

## 2012-09-15 NOTE — ED Notes (Signed)
Per EMS- pt is from [facility]. Pt was seen by staff holding his chest. EMS was called for this. Pt denies chest pain. Pt has hx of dementia.

## 2012-09-15 NOTE — ED Notes (Signed)
Patient changed, resting at this time

## 2012-09-15 NOTE — ED Notes (Signed)
This RN attempted to draw labs. Pt combative and attempted to bite Rn. Molly Maduro, PA aware.

## 2012-09-15 NOTE — ED Notes (Signed)
Attempted EKG. Pt was still grabbing and pulling leads off. Unable to get an EKG at this time.

## 2012-09-15 NOTE — ED Notes (Signed)
Patient becomes very agitated when attempting to help him.  Warm blanket given.

## 2012-09-15 NOTE — ED Notes (Signed)
2 rns attempting to change pt and clean him from soiled briefs. Pt will not allow rn to do EKG. Pt continues to pull at lead and take them off chest. PA and MD aware.

## 2012-09-15 NOTE — ED Notes (Signed)
Pt on BP and pulse ox monitor. Pt continues to rip EKG leads off as soon rn and EMT places them.

## 2012-09-15 NOTE — ED Provider Notes (Signed)
Medical screening examination/treatment/procedure(s) were conducted as a shared visit with non-physician practitioner(s) and myself.  I personally evaluated the patient during the encounter   I examined this patient.  He is very demented and unable to give a history. He was given haldol due to inability to cooperate to obtain EKG, labs or imaging.  Per nursing facility, he was holding his chest briefly PTA.  He denies pain now.  EKG ok. Trop ok.  Seems unlikely ACS or other emergent etiology.  He is noted to be resting comfortably on multiple exams.  Ashby Dawes, MD 09/15/12 [phone]

## 2012-09-16 NOTE — ED Notes (Signed)
[facility] here to take patient back to nursing.

## 2012-10-18 ENCOUNTER — Emergency Department (HOSPITAL_COMMUNITY)
Admission: EM | Admit: 2012-10-18 | Discharge: 2012-10-18 | Disposition: A | Discharge status: Home / Self Care | Payer: Medicare Other | Attending: Emergency Medicine | Admitting: Emergency Medicine

## 2012-10-18 ENCOUNTER — Encounter (HOSPITAL_COMMUNITY): Payer: Self-pay

## 2012-10-18 ENCOUNTER — Emergency Department (HOSPITAL_COMMUNITY): Payer: Medicare Other

## 2012-10-18 DIAGNOSIS — Z79899 Other long term (current) drug therapy: Secondary | ICD-10-CM | POA: Insufficient documentation

## 2012-10-18 DIAGNOSIS — Y939 Activity, unspecified: Secondary | ICD-10-CM | POA: Insufficient documentation

## 2012-10-18 DIAGNOSIS — Z043 Encounter for examination and observation following other accident: Secondary | ICD-10-CM | POA: Insufficient documentation

## 2012-10-18 DIAGNOSIS — Z859 Personal history of malignant neoplasm, unspecified: Secondary | ICD-10-CM | POA: Insufficient documentation

## 2012-10-18 DIAGNOSIS — I10 Essential (primary) hypertension: Secondary | ICD-10-CM | POA: Insufficient documentation

## 2012-10-18 DIAGNOSIS — F0390 Unspecified dementia without behavioral disturbance: Secondary | ICD-10-CM | POA: Insufficient documentation

## 2012-10-18 DIAGNOSIS — N289 Disorder of kidney and ureter, unspecified: Secondary | ICD-10-CM | POA: Insufficient documentation

## 2012-10-18 DIAGNOSIS — Y92009 Unspecified place in unspecified non-institutional (private) residence as the place of occurrence of the external cause: Secondary | ICD-10-CM | POA: Insufficient documentation

## 2012-10-18 DIAGNOSIS — W19XXXA Unspecified fall, initial encounter: Secondary | ICD-10-CM

## 2012-10-18 LAB — BASIC METABOLIC PANEL
BUN: 15 mg/dL (ref 6–23)
Chloride: 107 mEq/L (ref 96–112)
Creatinine, Ser: 1.29 mg/dL (ref 0.50–1.35)
GFR calc Af Amer: 58 mL/min — ABNORMAL LOW (ref >90)
GFR calc non Af Amer: 50 mL/min — ABNORMAL LOW (ref >90)
Potassium: 4.5 mEq/L (ref 3.5–5.1)

## 2012-10-18 LAB — CBC WITH DIFFERENTIAL/PLATELET
Basophils Absolute: 0 10*3/uL (ref 0.0–0.1)
Basophils Relative: 1 % (ref 0–1)
Eosinophils Absolute: 0.1 10*3/uL (ref 0.0–0.7)
MCH: 26.4 pg (ref 26.0–34.0)
MCHC: 31.7 g/dL (ref 30.0–36.0)
Monocytes Absolute: 0.6 10*3/uL (ref 0.1–1.0)
Neutro Abs: 2.8 10*3/uL (ref 1.7–7.7)
Neutrophils Relative %: 57 % (ref 43–77)
RDW: 20.6 % — ABNORMAL HIGH (ref 11.5–15.5)

## 2012-10-18 NOTE — ED Notes (Signed)
Pt moved out to the hallway beside room 12 because pt is attempting to climb out of the bed and is very combative with staff when attempts are made to help patient.

## 2012-10-18 NOTE — ED Notes (Signed)
[facility] called  

## 2012-10-18 NOTE — ED Notes (Signed)
Bed: [facility] Expected date:  Expected time:  Means of arrival:  Comments: Pt from room 12

## 2012-10-18 NOTE — ED Notes (Signed)
Patient transported to CT 

## 2012-10-18 NOTE — ED Notes (Signed)
Pt waiting for [facility] 

## 2012-10-18 NOTE — ED Notes (Signed)
Patient refused to get into a gown/ allow me to do vitals. RN Sharon Seller notified

## 2012-10-18 NOTE — ED Provider Notes (Signed)
CSN: [id number]     Arrival date & time 10/18/12  1142 History     First MD Initiated Contact with Patient 10/18/12 1221     Chief Complaint  Patient presents with  . Fall   (Consider location/radiation/quality/duration/timing/severity/associated sxs/prior Treatment) HPI  Cody [name] is a 79 y.o.male with a significant PMH of dementia, renal disorder, hypertension, hx of cancer presents to the ER with complaints of possible fall. He was found by nursing staff in another patients room and was laying on the floor by the wall, awake and alert. He has no recollection of why he is here, what happened. He is agitated but cooperative. Will not answer questions appropriately per his dementia. Vital signs are stable with no obvious signs of trauma. Pt denies pain anywhere.     Past Medical History  Diagnosis Date  . Dementia   . Renal disorder   . Hypertension   . Cancer     unknown   History reviewed. No pertinent past surgical history. No family history on file. History  Substance Use Topics  . Smoking status: Not on file  . Smokeless tobacco: Not on file  . Alcohol Use: Not on file    Review of Systems level 5 caveat: Pt has dementia/ AMS   Allergies  Ace inhibitors and Lipitor  Home Medications   Current Outpatient Rx  Name  Route  Sig  Dispense  Refill  . acetaminophen (TYLENOL) 325 MG tablet   Oral   Take 325 mg by mouth 2 (two) times daily.          . carvedilol (COREG) 3.125 MG tablet   Oral   Take 3.125 mg by mouth 2 (two) times daily with a meal.         . clonazePAM (KLONOPIN) 0.5 MG tablet   Oral   Take 0.25 mg by mouth 2 (two) times daily.         . divalproex (DEPAKOTE ER) 500 MG 24 hr tablet   Oral   Take 500 mg by mouth at bedtime.         . donepezil (ARICEPT) 10 MG tablet   Oral   Take 10 mg by mouth at bedtime.         Marland Kitchen HYDROcodone-acetaminophen (NORCO/VICODIN) 5-325 MG per tablet   Oral   Take 1 tablet by mouth 2 (two)  times daily as needed for pain.         Marland Kitchen sennosides-docusate sodium (SENOKOT-S) 8.6-50 MG tablet   Oral   Take 1 tablet by mouth daily.         . temazepam (RESTORIL) 15 MG capsule   Oral   Take 30 mg by mouth at bedtime.          BP 130/72  Pulse 76  Temp(Src) 95.5 F (35.3 C) (Rectal)  Resp 18  SpO2 100% Physical Exam  Nursing note and vitals reviewed. Constitutional: He appears well-developed and well-nourished. No distress.  HENT:  Head: Normocephalic and atraumatic. Head is without abrasion and without contusion.   HEENT: Anicteric.  No pallor.  No discharge from ears, eyes, nose, or mouth.  No signs of trauma.   Eyes: Pupils are equal, round, and reactive to light.  Neck: Normal range of motion. Neck supple.  Cardiovascular: Normal rate and regular rhythm.   Pulmonary/Chest: Effort normal.  Abdominal: Soft.  Neurological: He is alert.  Pt demented and will not follow commands to evaluate neuro exam.   Skin: Skin  is warm and dry.  Psychiatric: He is agitated.    ED Course   Procedures (including critical care time)  Labs Reviewed  CBC WITH DIFFERENTIAL - Abnormal; Notable for the following:    RBC 3.98 (*)    Hemoglobin 10.5 (*)    HCT 33.1 (*)    RDW 20.6 (*)    Platelets 656 (*)    All other components within normal limits  BASIC METABOLIC PANEL - Abnormal; Notable for the following:    GFR calc non Af Amer 50 (*)    GFR calc Af Amer 58 (*)    All other components within normal limits   Ct Head Wo Contrast  10/18/2012   CLINICAL DATA:  Fall.  EXAM: CT HEAD WITHOUT CONTRAST  CT CERVICAL SPINE WITHOUT CONTRAST  TECHNIQUE: Multidetector CT imaging of the head and cervical spine was performed following the standard protocol without intravenous contrast. Multiplanar CT image reconstructions of the cervical spine were also generated.  COMPARISON:  08/11/2012  FINDINGS: CT HEAD FINDINGS  There is atrophy and chronic small vessel disease changes. No acute  intracranial abnormality. Specifically, no hemorrhage, hydrocephalus, mass lesion, acute infarction, or significant intracranial injury. No acute calvarial abnormality. Visualized paranasal sinuses and mastoids clear. Orbital soft tissues unremarkable.  CT CERVICAL SPINE FINDINGS  Severe diffuse degenerative disc disease. Moderate degenerative facet disease. No fracture. No epidural or paraspinal hematoma. Normal alignment. Prevertebral soft tissues are normal. Large posterior subcutaneous soft tissue lipoma.  IMPRESSION: CT HEAD IMPRESSION  No acute intracranial abnormality.  Atrophy, chronic microvascular disease.  CT CERVICAL SPINE IMPRESSION  Advanced degenerative changes. No acute findings.   Electronically Signed   By: Charlett Nose   On: 10/18/2012 14:02   Ct Cervical Spine Wo Contrast  10/18/2012   CLINICAL DATA:  Fall.  EXAM: CT HEAD WITHOUT CONTRAST  CT CERVICAL SPINE WITHOUT CONTRAST  TECHNIQUE: Multidetector CT imaging of the head and cervical spine was performed following the standard protocol without intravenous contrast. Multiplanar CT image reconstructions of the cervical spine were also generated.  COMPARISON:  08/11/2012  FINDINGS: CT HEAD FINDINGS  There is atrophy and chronic small vessel disease changes. No acute intracranial abnormality. Specifically, no hemorrhage, hydrocephalus, mass lesion, acute infarction, or significant intracranial injury. No acute calvarial abnormality. Visualized paranasal sinuses and mastoids clear. Orbital soft tissues unremarkable.  CT CERVICAL SPINE FINDINGS  Severe diffuse degenerative disc disease. Moderate degenerative facet disease. No fracture. No epidural or paraspinal hematoma. Normal alignment. Prevertebral soft tissues are normal. Large posterior subcutaneous soft tissue lipoma.  IMPRESSION: CT HEAD IMPRESSION  No acute intracranial abnormality.  Atrophy, chronic microvascular disease.  CT CERVICAL SPINE IMPRESSION  Advanced degenerative changes. No  acute findings.   Electronically Signed   By: Charlett Nose   On: 10/18/2012 14:02   1. Fall at home, initial encounter     MDM   In triage the patients skin is warm and moist. He does not feel cold and is not shivering. Patient had large bowel movement and would not sit still for them to do rectal. They plan to recheck at discharge and I expect that it will be normal. Normal head and neck CT.   Lab work is unremarkable.  Case discussed with Dr. Freida Busman.  79 y.o.Cody [name]'s evaluation in the Emergency Department is complete. It has been determined that no acute conditions requiring further emergency intervention are present at this time. The patient/guardian have been advised of the diagnosis and plan. We have  discussed signs and symptoms that warrant return to the ED, such as changes or worsening in symptoms.   Patient/guardian has voiced understanding and agreed to follow-up with the PCP or specialist.   Dorthula Matas, PA-C 10/18/12 1525

## 2012-10-18 NOTE — ED Notes (Signed)
Pt presents from a nursing home. Pt was found by staff in another resident's room. Pt was found laying on the floor against the wall. Per EMS, facility has a policy that if there is even a possibility that the patient might have hit his head, pt must be seen at the hospital. Per EMS, no deformities, bleeding, or injuries noted on the patient.

## 2012-10-19 NOTE — ED Provider Notes (Signed)
Medical screening examination/treatment/procedure(s) were performed by non-physician practitioner and as supervising physician I was immediately available for consultation/collaboration.  Rayvon Dakin T Azar [place], MD 10/19/12 0653 

## 2012-11-18 ENCOUNTER — Encounter: Payer: Self-pay | Admitting: *Deleted

## 2012-11-18 DIAGNOSIS — B351 Tinea unguium: Secondary | ICD-10-CM | POA: Insufficient documentation

## 2012-12-01 ENCOUNTER — Ambulatory Visit: Payer: Self-pay | Admitting: Podiatrist

## 2013-05-01 ENCOUNTER — Emergency Department (HOSPITAL_COMMUNITY): Payer: Medicare Other

## 2013-05-01 ENCOUNTER — Encounter (HOSPITAL_COMMUNITY): Payer: Self-pay | Admitting: Emergency Medicine

## 2013-05-01 ENCOUNTER — Inpatient Hospital Stay (HOSPITAL_COMMUNITY)
Admission: EM | Admit: 2013-05-01 | Discharge: 2013-05-31 | DRG: 871 | Disposition: E | Payer: Medicare Other | Attending: Emergency Medicine | Admitting: Emergency Medicine

## 2013-05-01 DIAGNOSIS — A419 Sepsis, unspecified organism: Principal | ICD-10-CM

## 2013-05-01 DIAGNOSIS — Z79899 Other long term (current) drug therapy: Secondary | ICD-10-CM

## 2013-05-01 DIAGNOSIS — N19 Unspecified kidney failure: Secondary | ICD-10-CM

## 2013-05-01 DIAGNOSIS — I10 Essential (primary) hypertension: Secondary | ICD-10-CM | POA: Diagnosis present

## 2013-05-01 DIAGNOSIS — Z515 Encounter for palliative care: Secondary | ICD-10-CM

## 2013-05-01 DIAGNOSIS — R6520 Severe sepsis without septic shock: Secondary | ICD-10-CM

## 2013-05-01 DIAGNOSIS — D68318 Other hemorrhagic disorder due to intrinsic circulating anticoagulants, antibodies, or inhibitors: Secondary | ICD-10-CM | POA: Diagnosis present

## 2013-05-01 DIAGNOSIS — Z87891 Personal history of nicotine dependence: Secondary | ICD-10-CM

## 2013-05-01 DIAGNOSIS — Z8042 Family history of malignant neoplasm of prostate: Secondary | ICD-10-CM

## 2013-05-01 DIAGNOSIS — E875 Hyperkalemia: Secondary | ICD-10-CM | POA: Diagnosis present

## 2013-05-01 DIAGNOSIS — R4020 Unspecified coma: Secondary | ICD-10-CM | POA: Diagnosis not present

## 2013-05-01 DIAGNOSIS — N400 Enlarged prostate without lower urinary tract symptoms: Secondary | ICD-10-CM | POA: Diagnosis present

## 2013-05-01 DIAGNOSIS — J9600 Acute respiratory failure, unspecified whether with hypoxia or hypercapnia: Secondary | ICD-10-CM | POA: Diagnosis present

## 2013-05-01 DIAGNOSIS — N390 Urinary tract infection, site not specified: Secondary | ICD-10-CM | POA: Diagnosis present

## 2013-05-01 DIAGNOSIS — R4182 Altered mental status, unspecified: Secondary | ICD-10-CM | POA: Diagnosis present

## 2013-05-01 DIAGNOSIS — R34 Anuria and oliguria: Secondary | ICD-10-CM | POA: Diagnosis present

## 2013-05-01 DIAGNOSIS — Z807 Family history of other malignant neoplasms of lymphoid, hematopoietic and related tissues: Secondary | ICD-10-CM

## 2013-05-01 DIAGNOSIS — I509 Heart failure, unspecified: Secondary | ICD-10-CM

## 2013-05-01 DIAGNOSIS — J9690 Respiratory failure, unspecified, unspecified whether with hypoxia or hypercapnia: Secondary | ICD-10-CM

## 2013-05-01 DIAGNOSIS — E785 Hyperlipidemia, unspecified: Secondary | ICD-10-CM | POA: Diagnosis present

## 2013-05-01 DIAGNOSIS — D4959 Neoplasm of unspecified behavior of other genitourinary organ: Secondary | ICD-10-CM

## 2013-05-01 DIAGNOSIS — F0390 Unspecified dementia without behavioral disturbance: Secondary | ICD-10-CM

## 2013-05-01 DIAGNOSIS — N179 Acute kidney failure, unspecified: Secondary | ICD-10-CM | POA: Diagnosis present

## 2013-05-01 DIAGNOSIS — I214 Non-ST elevation (NSTEMI) myocardial infarction: Secondary | ICD-10-CM | POA: Diagnosis present

## 2013-05-01 DIAGNOSIS — C61 Malignant neoplasm of prostate: Secondary | ICD-10-CM | POA: Diagnosis present

## 2013-05-01 DIAGNOSIS — R6521 Severe sepsis with septic shock: Secondary | ICD-10-CM

## 2013-05-01 DIAGNOSIS — Z888 Allergy status to other drugs, medicaments and biological substances status: Secondary | ICD-10-CM

## 2013-05-01 HISTORY — DX: Hyperlipidemia, unspecified: E78.5

## 2013-05-01 HISTORY — DX: Personal history of malignant neoplasm of prostate: Z85.46

## 2013-05-01 HISTORY — DX: Personal history of other diseases of the digestive system: Z87.19

## 2013-05-01 HISTORY — DX: Personal history of other diseases of male genital organs: Z87.438

## 2013-05-01 HISTORY — DX: Heart failure, unspecified: I50.9

## 2013-05-01 LAB — CBC WITH DIFFERENTIAL/PLATELET
Basophils Absolute: 0 10*3/uL (ref 0.0–0.1)
Basophils Relative: 1 % (ref 0–1)
EOS PCT: 0 % (ref 0–5)
Eosinophils Absolute: 0 10*3/uL (ref 0.0–0.7)
HEMATOCRIT: 34.3 % — AB (ref 39.0–52.0)
Hemoglobin: 9.9 g/dL — ABNORMAL LOW (ref 13.0–17.0)
Lymphocytes Relative: 16 % (ref 12–46)
Lymphs Abs: 0.8 10*3/uL (ref 0.7–4.0)
MCH: 23.9 pg — ABNORMAL LOW (ref 26.0–34.0)
MCHC: 28.9 g/dL — ABNORMAL LOW (ref 30.0–36.0)
MCV: 82.9 fL (ref 78.0–100.0)
MONOS PCT: 13 % — AB (ref 3–12)
Monocytes Absolute: 0.6 10*3/uL (ref 0.1–1.0)
NRBC: 16 /100{WBCs} — AB
Neutro Abs: 3.4 10*3/uL (ref 1.7–7.7)
Neutrophils Relative %: 70 % (ref 43–77)
Platelets: 381 10*3/uL (ref 150–400)
RBC: 4.14 MIL/uL — AB (ref 4.22–5.81)
RDW: 24.1 % — AB (ref 11.5–15.5)
WBC: 4.8 10*3/uL (ref 4.0–10.5)

## 2013-05-01 LAB — URINE MICROSCOPIC-ADD ON

## 2013-05-01 LAB — BLOOD GAS, ARTERIAL
Acid-base deficit: 7 mmol/L — ABNORMAL HIGH (ref 0.0–2.0)
Bicarbonate: 18.6 mEq/L — ABNORMAL LOW (ref 20.0–24.0)
Drawn by: 307971
FIO2: 1 %
MECHVT: 500 mL
O2 Saturation: 98.6 %
PEEP: 5 cmH2O
Patient temperature: 98.6
RATE: 14 resp/min
TCO2: 17.6 mmol/L (ref 0–100)
pCO2 arterial: 40 mmHg (ref 35.0–45.0)
pH, Arterial: 7.289 — ABNORMAL LOW (ref 7.350–7.450)
pO2, Arterial: 282 mmHg — ABNORMAL HIGH (ref 80.0–100.0)

## 2013-05-01 LAB — COMPREHENSIVE METABOLIC PANEL
ALK PHOS: 68 U/L (ref 39–117)
ALT: 149 U/L — AB (ref 0–53)
AST: 244 U/L — ABNORMAL HIGH (ref 0–37)
Albumin: 2.2 g/dL — ABNORMAL LOW (ref 3.5–5.2)
BUN: 74 mg/dL — ABNORMAL HIGH (ref 6–23)
CHLORIDE: 128 meq/L — AB (ref 96–112)
CO2: 21 meq/L (ref 19–32)
Calcium: 7.8 mg/dL — ABNORMAL LOW (ref 8.4–10.5)
Creatinine, Ser: 5.42 mg/dL — ABNORMAL HIGH (ref 0.50–1.35)
GFR, EST AFRICAN AMERICAN: 10 mL/min — AB (ref >90)
GFR, EST NON AFRICAN AMERICAN: 9 mL/min — AB (ref >90)
GLUCOSE: 126 mg/dL — AB (ref 70–99)
POTASSIUM: 6.3 meq/L — AB (ref 3.7–5.3)
SODIUM: 164 meq/L — AB (ref 137–147)
Total Bilirubin: 0.6 mg/dL (ref 0.3–1.2)
Total Protein: 5.5 g/dL — ABNORMAL LOW (ref 6.0–8.3)

## 2013-05-01 LAB — URINALYSIS, ROUTINE W REFLEX MICROSCOPIC
GLUCOSE, UA: NEGATIVE mg/dL
Hgb urine dipstick: NEGATIVE
KETONES UR: NEGATIVE mg/dL
NITRITE: NEGATIVE
PROTEIN: 30 mg/dL — AB
Specific Gravity, Urine: 1.023 (ref 1.005–1.030)
Urobilinogen, UA: 1 mg/dL (ref 0.0–1.0)
pH: 5 (ref 5.0–8.0)

## 2013-05-01 LAB — PROCALCITONIN: Procalcitonin: <0.1 ng/mL

## 2013-05-01 LAB — STREP PNEUMONIAE URINARY ANTIGEN: STREP PNEUMO URINARY ANTIGEN: NEGATIVE

## 2013-05-01 LAB — BASIC METABOLIC PANEL
BUN: 70 mg/dL — AB (ref 6–23)
BUN: 68 mg/dL — ABNORMAL HIGH (ref 6–23)
CO2: 19 mEq/L (ref 19–32)
CO2: 17 meq/L — AB (ref 19–32)
Calcium: 7.5 mg/dL — ABNORMAL LOW (ref 8.4–10.5)
Calcium: 7.1 mg/dL — ABNORMAL LOW (ref 8.4–10.5)
Chloride: >130 mEq/L (ref 96–112)
Creatinine, Ser: 5.54 mg/dL — ABNORMAL HIGH (ref 0.50–1.35)
Creatinine, Ser: 5.49 mg/dL — ABNORMAL HIGH (ref 0.50–1.35)
GFR calc Af Amer: 10 mL/min — ABNORMAL LOW (ref >90)
GFR calc Af Amer: 10 mL/min — ABNORMAL LOW (ref >90)
GFR, EST NON AFRICAN AMERICAN: 9 mL/min — AB (ref >90)
GFR, EST NON AFRICAN AMERICAN: 9 mL/min — AB (ref >90)
GLUCOSE: 113 mg/dL — AB (ref 70–99)
Glucose, Bld: 133 mg/dL — ABNORMAL HIGH (ref 70–99)
POTASSIUM: 5.8 meq/L — AB (ref 3.7–5.3)
POTASSIUM: 5.4 meq/L — AB (ref 3.7–5.3)
SODIUM: 165 meq/L — AB (ref 137–147)
Sodium: 163 mEq/L (ref 137–147)

## 2013-05-01 LAB — PROTIME-INR
INR: 1.66 — AB (ref 0.00–1.49)
Prothrombin Time: 19.1 seconds — ABNORMAL HIGH (ref 11.6–15.2)

## 2013-05-01 LAB — MRSA PCR SCREENING: MRSA by PCR: NEGATIVE

## 2013-05-01 LAB — PHOSPHORUS: Phosphorus: 1.9 mg/dL — ABNORMAL LOW (ref 2.3–4.6)

## 2013-05-01 LAB — I-STAT CG4 LACTIC ACID, ED
Lactic Acid, Venous: 7.53 mmol/L — ABNORMAL HIGH (ref 0.5–2.2)
Lactic Acid, Venous: 5.12 mmol/L — ABNORMAL HIGH (ref 0.5–2.2)

## 2013-05-01 LAB — APTT: aPTT: 41 seconds — ABNORMAL HIGH (ref 24–37)

## 2013-05-01 LAB — CORTISOL: Cortisol, Plasma: 22.8 ug/dL

## 2013-05-01 LAB — TROPONIN I
Troponin I: >20 ng/mL (ref <0.30)
Troponin I: >20 ng/mL (ref <0.30)

## 2013-05-01 LAB — LACTIC ACID, PLASMA: LACTIC ACID, VENOUS: 4.1 mmol/L — AB (ref 0.5–2.2)

## 2013-05-01 LAB — MAGNESIUM: MAGNESIUM: 2.2 mg/dL (ref 1.5–2.5)

## 2013-05-01 MED ORDER — PIPERACILLIN-TAZOBACTAM 3.375 G IVPB 30 MIN
3.3750 g | Freq: Once | INTRAVENOUS | Status: AC
  Administered 2013-05-01: 3.375 g via INTRAVENOUS
  Filled 2013-05-01: qty 50

## 2013-05-01 MED ORDER — VANCOMYCIN HCL IN DEXTROSE 1-5 GM/200ML-% IV SOLN
1000.0000 mg | Freq: Once | INTRAVENOUS | Status: AC
  Administered 2013-05-01: 1000 mg via INTRAVENOUS
  Filled 2013-05-01: qty 200

## 2013-05-01 MED ORDER — ACETAMINOPHEN 650 MG RE SUPP
650.0000 mg | RECTAL | Status: DC | PRN
  Administered 2013-05-01 – 2013-05-06 (×3): 650 mg via RECTAL
  Filled 2013-05-01 (×4): qty 1

## 2013-05-01 MED ORDER — ASPIRIN 300 MG RE SUPP
300.0000 mg | Freq: Every day | RECTAL | Status: DC
  Administered 2013-05-01 – 2013-05-03 (×3): 300 mg via RECTAL
  Filled 2013-05-01 (×3): qty 1

## 2013-05-01 MED ORDER — SODIUM CHLORIDE 0.9 % IV BOLUS (SEPSIS)
1000.0000 mL | Freq: Once | INTRAVENOUS | Status: AC
  Administered 2013-05-01: 1000 mL via INTRAVENOUS

## 2013-05-01 MED ORDER — SODIUM CHLORIDE 0.45 % IV SOLN
INTRAVENOUS | Status: DC
  Administered 2013-05-01 – 2013-05-06 (×4): via INTRAVENOUS

## 2013-05-01 MED ORDER — SODIUM POLYSTYRENE SULFONATE 15 GM/60ML PO SUSP
30.0000 g | Freq: Once | ORAL | Status: DC
  Filled 2013-05-01: qty 120

## 2013-05-01 MED ORDER — DEXTROSE 5 % IV SOLN
INTRAVENOUS | Status: DC
  Administered 2013-05-01 – 2013-05-02 (×2): via INTRAVENOUS
  Filled 2013-05-01 (×4): qty 150

## 2013-05-01 MED ORDER — NOREPINEPHRINE BITARTRATE 1 MG/ML IJ SOLN
2.0000 ug/min | INTRAVENOUS | Status: DC
  Administered 2013-05-01: 2 ug/min via INTRAVENOUS
  Administered 2013-05-01: 15 ug/min via INTRAVENOUS
  Administered 2013-05-01: 20 ug/min via INTRAVENOUS
  Filled 2013-05-01 (×2): qty 4

## 2013-05-01 MED ORDER — FENTANYL CITRATE 0.05 MG/ML IJ SOLN
50.0000 ug | Freq: Once | INTRAMUSCULAR | Status: AC
  Administered 2013-05-01: 50 ug via INTRAVENOUS
  Filled 2013-05-01: qty 2

## 2013-05-01 MED ORDER — LIDOCAINE HCL (CARDIAC) 20 MG/ML IV SOLN
INTRAVENOUS | Status: AC
  Filled 2013-05-01: qty 5

## 2013-05-01 MED ORDER — HEPARIN (PORCINE) IN NACL 100-0.45 UNIT/ML-% IJ SOLN
1100.0000 [IU]/h | INTRAMUSCULAR | Status: DC
  Administered 2013-05-01 (×2): 1100 [IU]/h via INTRAVENOUS
  Filled 2013-05-01: qty 250

## 2013-05-01 MED ORDER — SODIUM CHLORIDE 0.9 % IV SOLN
Freq: Once | INTRAVENOUS | Status: AC
  Administered 2013-05-01: 13:00:00 via INTRAVENOUS

## 2013-05-01 MED ORDER — SODIUM CHLORIDE 0.9 % IV SOLN
0.0000 ug/h | INTRAVENOUS | Status: DC
  Administered 2013-05-01 (×2): 50 ug/h via INTRAVENOUS
  Filled 2013-05-01: qty 50

## 2013-05-01 MED ORDER — PANTOPRAZOLE SODIUM 40 MG IV SOLR
40.0000 mg | Freq: Every day | INTRAVENOUS | Status: DC
  Administered 2013-05-01 – 2013-05-02 (×2): 40 mg via INTRAVENOUS
  Filled 2013-05-01 (×4): qty 40

## 2013-05-01 MED ORDER — ROCURONIUM BROMIDE 50 MG/5ML IV SOLN
INTRAVENOUS | Status: AC
  Filled 2013-05-01: qty 2

## 2013-05-01 MED ORDER — CHLORHEXIDINE GLUCONATE 0.12 % MT SOLN
15.0000 mL | Freq: Two times a day (BID) | OROMUCOSAL | Status: DC
  Administered 2013-05-01 – 2013-05-06 (×11): 15 mL via OROMUCOSAL
  Filled 2013-05-01 (×13): qty 15

## 2013-05-01 MED ORDER — SODIUM CHLORIDE 0.9 % IV SOLN: INTRAVENOUS | Status: DC

## 2013-05-01 MED ORDER — ETOMIDATE 2 MG/ML IV SOLN
INTRAVENOUS | Status: AC
  Administered 2013-05-01: 20 mg
  Filled 2013-05-01: qty 20

## 2013-05-01 MED ORDER — SUCCINYLCHOLINE CHLORIDE 20 MG/ML IJ SOLN
INTRAMUSCULAR | Status: AC
  Administered 2013-05-01: 100 mg
  Filled 2013-05-01: qty 1

## 2013-05-01 MED ORDER — NOREPINEPHRINE BITARTRATE 1 MG/ML IJ SOLN
2.0000 ug/min | INTRAVENOUS | Status: DC
  Administered 2013-05-01: 30 ug/min via INTRAVENOUS
  Administered 2013-05-02: 4 ug/min via INTRAVENOUS
  Filled 2013-05-01 (×3): qty 16

## 2013-05-01 MED ORDER — PIPERACILLIN-TAZOBACTAM IN DEX 2-0.25 GM/50ML IV SOLN
2.2500 g | Freq: Four times a day (QID) | INTRAVENOUS | Status: DC
  Administered 2013-05-01 – 2013-05-03 (×7): 2.25 g via INTRAVENOUS
  Filled 2013-05-01 (×11): qty 50

## 2013-05-01 MED ORDER — BIOTENE DRY MOUTH MT LIQD
15.0000 mL | Freq: Four times a day (QID) | OROMUCOSAL | Status: DC
  Administered 2013-05-01 – 2013-05-06 (×15): 15 mL via OROMUCOSAL

## 2013-05-01 NOTE — ED Notes (Signed)
MD at beside placing femoral central line.

## 2013-05-01 NOTE — Progress Notes (Signed)
Clinical Social Work Department BRIEF PSYCHOSOCIAL ASSESSMENT 05/16/2013  Patient:  Cody [name], Cody [name]     Account Number:  [id number]     Admit date:  05/14/2013  Clinical Social Worker:  Luretha Rued  Date/Time:  05/25/2013 02:22 PM  Referred by:  CSW  Date Referred:  05/13/2013 Referred for  SNF Placement   Other Referral:   Interview type:  Family Other interview type:   Patient was intubated at the of assessment so CSW spoke with wife at bedside.    PSYCHOSOCIAL DATA Living Status:  FACILITY Admitted from facility:  [facility] Level of care:  Assisted Living Primary support name:  Lydia Meng Primary support relationship to patient:  SPOUSE Degree of support available:   High lever of support.  Wife was accompanied by her sister for support.    CURRENT CONCERNS Current Concerns  Post-Acute Placement   Other Concerns:    SOCIAL WORK ASSESSMENT / PLAN CSW met with wife and sister in-law at bedside to complete this assessment.  At this time the patient intubated and unable to communicate.  According to the wife the patient has been at the ALF for about a year and since admission his health has declined.  Wife reports that he has had multiple falls in the past year and in this last week stopped eating.  Wife reports that he stopped communicating with her in the past couple months so at this point they are open to a higher level of care.  CSW providing the family with knowledge of the support given by the social work department. CSW provided the family with a [employer] for review.   Assessment/plan status:  Psychosocial Support/Ongoing Assessment of Needs Other assessment/ plan:   Information/referral to community resources:   SNF    PATIENT'S/FAMILY'S RESPONSE TO PLAN OF CARE: Family shared their appreciation for the prompt response of the social work department. Family is open to the possiblity of SNF admission.        Chesley Noon, MSW, [place], 05/03/2013, 2:38 PM Evening Clinical Social Worker [phone]

## 2013-05-01 NOTE — ED Notes (Signed)
Respiratory called for transport assistance. 

## 2013-05-01 NOTE — Sedation Documentation (Signed)
Intubation in process. Cords visualized, color change verified. Positive breath sounds bilateral

## 2013-05-01 NOTE — ED Provider Notes (Signed)
CSN: [id number]     Arrival date & time 05/30/2013  1202 History   First MD Initiated Contact with Patient 05/10/2013 1205     Chief Complaint  Patient presents with  . Hypotension  . Altered Mental Status     (Consider location/radiation/quality/duration/timing/severity/associated sxs/prior Treatment) HPI Comments: Patient presents to the ER for evaluation of unresponsiveness. Patient is a nursing home patient. He has reportedly been declining over a period of 4 days. He has had progressively diminished responsiveness and today he could not be awakened. This information is provided by EMS, no family present. Patient is unresponsive, cannot provide any further information. Level V Caveat due to unresponsiveness.  Patient is a 80 y.o. male presenting with altered mental status.  Altered Mental Status   Past Medical History  Diagnosis Date  . Dementia   . Renal disorder   . Hypertension   . Onychomycosis [id number]  . History of prostate cancer 2003    Treated with external beam radiation  . Cancer   . Hyperlipidemia   . History of BPH   . History of lower GI bleeding 2000  . CHF (congestive heart failure)    History reviewed. No pertinent past surgical history. Family History  Problem Relation Age of Onset  . Other Mother     Multiple Myeloma  . Appendicitis Father   . Prostate cancer Brother    History  Substance Use Topics  . Smoking status: Former Smoker    Types: Cigarettes    Quit date: 05/01/1968  . Smokeless tobacco: Never Used  . Alcohol Use: No    Review of Systems  Unable to perform ROS: Patient unresponsive      Allergies  Ace inhibitors and Lipitor  Home Medications   No current outpatient prescriptions on file. BP 89/57  Pulse 107  Temp(Src) 100.5 F (38.1 C) (Axillary)  Resp 16  Ht _0  (1.803 m)  Wt 164 lb 7.4 oz (74.6 kg)  BMI 22.95 kg/m2  SpO2 95% Physical Exam  Constitutional: He appears distressed.  HENT:  Head: Normocephalic and  atraumatic.  Eyes:  Pupils pinpoint bilaterally  Neck: Neck supple.  Cardiovascular: Normal rate and regular rhythm.   Pulmonary/Chest: Accessory muscle usage present. Tachypnea noted. He has rales.  Abdominal: Soft. Bowel sounds are absent.  Musculoskeletal: He exhibits no edema.  Neurological: He is unresponsive. No cranial nerve deficit. GCS eye subscore is 1. GCS verbal subscore is 1. GCS motor subscore is 4.  Psychiatric: He is noncommunicative.    ED Course  INTUBATION Date/Time: 05/13/2013 12:22 PM Performed by: Orpah Greek. Authorized by: Orpah Greek Consent: The procedure was performed in an emergent situation. Patient identity confirmed: arm band and hospital-assigned identification number Time out: Immediately prior to procedure a "time out" was called to verify the correct patient, procedure, equipment, support staff and site/side marked as required. Indications: respiratory distress,  respiratory failure,  airway protection and  hypoxemia Intubation method: video-assisted Patient status: paralyzed (RSI) Preoxygenation: nonrebreather mask and BVM Pretreatment medications: none Sedatives: etomidate Paralytic: succinylcholine Laryngoscope size: Mac 4 Tube size: 7.5 mm Tube type: cuffed Number of attempts: 1 Cricoid pressure: no Cords visualized: yes Post-procedure assessment: chest rise and CO2 detector Breath sounds: equal Cuff inflated: yes ETT to lip: 25 cm Tube secured with: ETT holder Chest x-ray interpreted by radiologist. Chest x-ray findings: endotracheal tube in appropriate position Patient tolerance: Patient tolerated the procedure well with no immediate complications.  CENTRAL LINE Date/Time: 05/27/2013 1:00 PM Performed  by: Eriana Suliman J. Authorized by: Orpah Greek Consent: The procedure was performed in an emergent situation. Patient identity confirmed: arm band and hospital-assigned identification  number Time out: Immediately prior to procedure a "time out" was called to verify the correct patient, procedure, equipment, support staff and site/side marked as required. Indications: vascular access Anesthesia: local infiltration Local anesthetic: lidocaine 2% without epinephrine Anesthetic total: 5 ml Patient sedated: no Skin prep agent dried: skin prep agent completely dried prior to procedure Sterile barriers: all five maximum sterile barriers used - cap, mask, sterile gown, sterile gloves, and large sterile sheet Hand hygiene: hand hygiene performed prior to central venous catheter insertion Location details: right femoral Site selection rationale: required fast access for hyptension secondary to sepsis Patient position: flat Catheter type: triple lumen Pre-procedure: landmarks identified Ultrasound guidance: no Number of attempts: 2 Successful placement: yes Post-procedure: line sutured and dressing applied Assessment: blood return through all ports and free fluid flow Patient tolerance: Patient tolerated the procedure well with no immediate complications.   (including critical care time) Labs Review Labs Reviewed  CBC WITH DIFFERENTIAL - Abnormal; Notable for the following:    RBC 4.14 (*)    Hemoglobin 9.9 (*)    HCT 34.3 (*)    MCH 23.9 (*)    MCHC 28.9 (*)    RDW 24.1 (*)    Monocytes Relative 13 (*)    nRBC 16 (*)    All other components within normal limits  COMPREHENSIVE METABOLIC PANEL - Abnormal; Notable for the following:    Sodium 164 (*)    Potassium 6.3 (*)    Chloride 128 (*)    Glucose, Bld 126 (*)    BUN 74 (*)    Creatinine, Ser 5.42 (*)    Calcium 7.8 (*)    Total Protein 5.5 (*)    Albumin 2.2 (*)    AST 244 (*)    ALT 149 (*)    GFR calc non Af Amer 9 (*)    GFR calc Af Amer 10 (*)    All other components within normal limits  URINALYSIS, ROUTINE W REFLEX MICROSCOPIC - Abnormal; Notable for the following:    Color, Urine AMBER (*)     APPearance CLOUDY (*)    Bilirubin Urine MODERATE (*)    Protein, ur 30 (*)    Leukocytes, UA TRACE (*)    All other components within normal limits  PHOSPHORUS - Abnormal; Notable for the following:    Phosphorus 1.9 (*)    All other components within normal limits  LACTIC ACID, PLASMA - Abnormal; Notable for the following:    Lactic Acid, Venous 4.1 (*)    All other components within normal limits  PROTIME-INR - Abnormal; Notable for the following:    Prothrombin Time 19.1 (*)    INR 1.66 (*)    All other components within normal limits  APTT - Abnormal; Notable for the following:    aPTT 41 (*)    All other components within normal limits  URINE MICROSCOPIC-ADD ON - Abnormal; Notable for the following:    Bacteria, UA FEW (*)    Casts HYALINE CASTS (*)    All other components within normal limits  BLOOD GAS, ARTERIAL - Abnormal; Notable for the following:    pH, Arterial 7.289 (*)    pO2, Arterial 282.0 (*)    Bicarbonate 18.6 (*)    Acid-base deficit 7.0 (*)    All other components within normal limits  BASIC METABOLIC PANEL - Abnormal; Notable for the following:    Sodium 165 (*)    Potassium 5.8 (*)    Chloride >130 (*)    Glucose, Bld 133 (*)    BUN 70 (*)    Creatinine, Ser 5.54 (*)    Calcium 7.5 (*)    GFR calc non Af Amer 9 (*)    GFR calc Af Amer 10 (*)    All other components within normal limits  TROPONIN I - Abnormal; Notable for the following:    Troponin I >20.00 (*)    All other components within normal limits  TROPONIN I - Abnormal; Notable for the following:    Troponin I >20.00 (*)    All other components within normal limits  TROPONIN I - Abnormal; Notable for the following:    Troponin I >20.00 (*)    All other components within normal limits  CBC - Abnormal; Notable for the following:    Hemoglobin 10.8 (*)    HCT 34.9 (*)    MCH 24.9 (*)    RDW 24.2 (*)    All other components within normal limits  BASIC METABOLIC PANEL - Abnormal;  Notable for the following:    Sodium 161 (*)    Potassium 5.4 (*)    Chloride 127 (*)    Glucose, Bld 102 (*)    BUN 71 (*)    Creatinine, Ser 5.48 (*)    Calcium 7.3 (*)    GFR calc non Af Amer 9 (*)    GFR calc Af Amer 10 (*)    All other components within normal limits  BLOOD GAS, ARTERIAL - Abnormal; Notable for the following:    pCO2 arterial 31.1 (*)    pO2, Arterial 101.0 (*)    Bicarbonate 17.3 (*)    Acid-base deficit 6.6 (*)    All other components within normal limits  BASIC METABOLIC PANEL - Abnormal; Notable for the following:    Sodium 163 (*)    Potassium 5.4 (*)    Chloride >130 (*)    CO2 17 (*)    Glucose, Bld 113 (*)    BUN 68 (*)    Creatinine, Ser 5.49 (*)    Calcium 7.1 (*)    GFR calc non Af Amer 9 (*)    GFR calc Af Amer 10 (*)    All other components within normal limits  HEPARIN LEVEL (UNFRACTIONATED) - Abnormal; Notable for the following:    Heparin Unfractionated 0.87 (*)    All other components within normal limits  CBC - Abnormal; Notable for the following:    RBC 3.82 (*)    Hemoglobin 9.7 (*)    HCT 30.2 (*)    MCH 25.4 (*)    RDW 23.8 (*)    All other components within normal limits  CBC - Abnormal; Notable for the following:    RBC 3.61 (*)    Hemoglobin 8.9 (*)    HCT 28.6 (*)    MCH 24.7 (*)    RDW 23.8 (*)    All other components within normal limits  BASIC METABOLIC PANEL - Abnormal; Notable for the following:    Sodium 155 (*)    Chloride 120 (*)    Glucose, Bld 109 (*)    BUN 59 (*)    Creatinine, Ser 4.42 (*)    Calcium 7.3 (*)    GFR calc non Af Amer 11 (*)    GFR calc Af Amer 13 (*)  All other components within normal limits  PROTIME-INR - Abnormal; Notable for the following:    Prothrombin Time 22.0 (*)    INR 1.99 (*)    All other components within normal limits  APTT - Abnormal; Notable for the following:    aPTT 47 (*)    All other components within normal limits  GLUCOSE, CAPILLARY - Abnormal; Notable for  the following:    Glucose-Capillary 104 (*)    All other components within normal limits  GLUCOSE, CAPILLARY - Abnormal; Notable for the following:    Glucose-Capillary 109 (*)    All other components within normal limits  I-STAT CG4 LACTIC ACID, ED - Abnormal; Notable for the following:    Lactic Acid, Venous 7.53 (*)    All other components within normal limits  I-STAT CG4 LACTIC ACID, ED - Abnormal; Notable for the following:    Lactic Acid, Venous 5.12 (*)    All other components within normal limits  CULTURE, BLOOD (ROUTINE X 2)  CULTURE, BLOOD (ROUTINE X 2)  URINE CULTURE  CULTURE, RESPIRATORY (NON-EXPECTORATED)  MRSA PCR SCREENING  MAGNESIUM  PROCALCITONIN  CORTISOL  STREP PNEUMONIAE URINARY ANTIGEN  LEGIONELLA ANTIGEN, URINE  VANCOMYCIN, RANDOM  HEPARIN LEVEL (UNFRACTIONATED)  GLUCOSE, CAPILLARY  PHOSPHORUS  MAGNESIUM  GLUCOSE, CAPILLARY  GLUCOSE, CAPILLARY  GLUCOSE, CAPILLARY   Imaging Review Dg Chest Port 1 View  05/03/2013   CLINICAL DATA:  Endotracheal tube position  EXAM: PORTABLE CHEST - 1 VIEW  COMPARISON:  05/02/2013  FINDINGS: Endotracheal tube ends in the mid thoracic trachea. Orogastric tube enters the stomach. Coiling of the orogastric tube overlapping the head/neck is likely external to the patient given the course of the tubing.  Indistinct basilar opacities, favoring atelectasis. No effusion or pneumothorax. Normal heart size.  IMPRESSION: 1. Unchanged positioning of endotracheal and orogastric tubes, as above. 2. Unchanged bibasilar atelectasis.   Electronically Signed   By: Jorje Guild M.D.   On: 05/03/2013 05:58     EKG Interpretation   Date/Time:  Monday May 01 2013 13:41:00 EST Ventricular Rate:  88 PR Interval:  139 QRS Duration: 98 QT Interval:  410 QTC Calculation: 496 R Axis:   64 Text Interpretation:  Sinus rhythm Repol abnrm, global ischemia, diffuse  leads ED PHYSICIAN INTERPRETATION AVAILABLE IN [facility] Confirmed  by  TEST, Record ([id number]) on 05/03/2013 7:13:41 AM      MDM   Final diagnoses:  Respiratory failure  Dementia  Renal failure  Septic shock    Patient presented to the ER for evaluation of unresponsiveness. The patient was found to be febrile, hypotensive upon arrival. There was tachypnea and respiratory distress. He has reported that the patient was supposed to be entering hospice, but that there was no T1 are in place. Attempts to contact the family to confirm their wishes were unsuccessful it was ultimately felt that the patient would require intubation for respiratory failure. Intubation was performed without difficulty.  Prior to this, EMS had administered Narcan to the patient. They report that his respiration rate was below 10 when they initially found him, and that his respiratory rate and increased after Narcan. Additional Narcan here in the ER did not result in any further changes.  It was felt that the patient was septic. Empiric antibiotic coverage was ordered. Patient was administered judicious IV fluids with some improvement of his blood pressure. A central line had been placed to help with fluid administration, antibiotic administration, with the anticipation of possible pressors if fluid  is also patient did not result in adequate blood pressure.  The patient's condition did somewhat improve with the above-mentioned treatments and was admitted to the ICU for further management.  CRITICAL CARE Performed by: Orpah Greek   Total critical care time: 15mn  Critical care time was exclusive of separately billable procedures and treating other patients.  Critical care was necessary to treat or prevent imminent or life-threatening deterioration.  Critical care was time spent personally by me on the following activities: development of treatment plan with patient and/or surrogate as well as nursing, discussions with consultants, evaluation of patient's response to treatment,  examination of patient, obtaining history from patient or surrogate, ordering and performing treatments and interventions, ordering and review of laboratory studies, ordering and review of radiographic studies, pulse oximetry and re-evaluation of patient's condition.     COrpah Greek MD 05/04/13 1059

## 2013-05-01 NOTE — ED Notes (Signed)
ICU will call when ready for pt. Pt will be going to room 1239

## 2013-05-01 NOTE — ED Notes (Signed)
Pt from [facility]. Facility reports 3-4 day decline in mental status and health. EMS states pt has been unresponsive during transport. [place] last night, 2mg  narcan given by EMS with some improvement in respirations. PT hypotensive with EMS.  Pt hypotensive on arrival. PT full code. 0.4mg  narcan given upon arrival. MD at bedside.

## 2013-05-01 NOTE — H&P (Signed)
Name: Cody [name] MRN: [id number] DOB: [birth year]    ADMISSION DATE:  05/09/2013   REFERRING MD :  EDP PRIMARY SERVICE: PCCM  CHIEF COMPLAINT:  Sepsis / shock  BRIEF PATIENT DESCRIPTION:  80 yo NHP with known prostate cancer admitted with AMS, shock, presumed sepsis, VDRF. Limited Code status  SIGNIFICANT EVENTS / STUDIES:   LINES / TUBES: 3/2 ott>> 3/2 rt fem cvl>>  CULTURES: 3/2 sputum>> 3/2 bc x 2>> 3/2 uc>>  ANTIBIOTICS: 3/2 vanc>> 3/2 pip-tazo>>  HISTORY OF PRESENT ILLNESS:   80 yo NHP with known prostate cancer whom family had been checking on hospice evaluation. Brought to ED with decreased MS, progressive over ~4 days. Noted to be hypotensive, lactic acid of 5,  required urgent intubation and fluid resuscitation in the emergency room.  No family were available for consultation. PCCM asked to admit.  PAST MEDICAL HISTORY :  Past Medical History  Diagnosis Date  . Dementia   . Renal disorder   . Hypertension   . Onychomycosis [id number]  . History of prostate cancer 2003    Treated with external beam radiation  . Cancer   . Hyperlipidemia   . History of BPH   . History of lower GI bleeding 2000  . CHF (congestive heart failure)    History reviewed. No pertinent past surgical history. Prior to Admission medications   Medication Sig Start Date End Date Taking? Authorizing Provider  acetaminophen (TYLENOL) 325 MG tablet Take 325 mg by mouth 2 (two) times daily.    Yes Historical Provider, MD  carvedilol (COREG) 3.125 MG tablet Take 3.125 mg by mouth 2 (two) times daily with a meal.   Yes Historical Provider, MD  clonazePAM (KLONOPIN) 0.5 MG tablet Take 0.25 mg by mouth 2 (two) times daily.   Yes Historical Provider, MD  divalproex (DEPAKOTE ER) 500 MG 24 hr tablet Take 500 mg by mouth at bedtime.   Yes Historical Provider, MD  donepezil (ARICEPT) 10 MG tablet Take 10 mg by mouth at bedtime.   Yes Historical Provider, MD  HYDROcodone-acetaminophen  (NORCO/VICODIN) 5-325 MG per tablet Take 1 tablet by mouth 2 (two) times daily as needed for pain.   Yes Historical Provider, MD  sennosides-docusate sodium (SENOKOT-S) 8.6-50 MG tablet Take 1 tablet by mouth daily.   Yes Historical Provider, MD  temazepam (RESTORIL) 15 MG capsule Take 30 mg by mouth at bedtime.   Yes Historical Provider, MD   Allergies  Allergen Reactions  . Ace Inhibitors Other (See Comments)    Hyperkalemia  . Lipitor [Atorvastatin] Other (See Comments)    Myalgias    FAMILY HISTORY:  Family History  Problem Relation Age of Onset  . Other Mother     Multiple Myeloma  . Appendicitis Father   . Prostate cancer Brother    SOCIAL HISTORY:  reports that he quit smoking about 45 years ago. His smoking use included Cigarettes. He smoked 0.00 packs per day. He has never used smokeless tobacco. He reports that he does not drink alcohol or use illicit drugs.  REVIEW OF SYSTEMS:  NA  SUBJECTIVE:  Unable to obtain  VITAL SIGNS: Temp:  [99.9 F (37.7 C)-102.5 F (39.2 C)] 99.9 F (37.7 C) (03/02 1512) Pulse Rate:  [69-112] 82 (03/02 1510) Resp:  [13-31] 16 (03/02 1510) BP: (70-103)/(46-69) 89/56 mmHg (03/02 1510) SpO2:  [87 %-100 %] 95 % (03/02 1510) FiO2 (%):  [100 %] 100 % (03/02 1253) Weight:  [81.647 kg (180 lb)] 81.647  kg (180 lb) (03/02 1316) HEMODYNAMICS:   VENTILATOR SETTINGS: Vent Mode:  [-] PRVC FiO2 (%):  [100 %] 100 % Set Rate:  [14 bmp] 14 bmp Vt Set:  [500 mL] 500 mL PEEP:  [5 cmH20] 5 cmH20 Plateau Pressure:  [20 cmH20] 20 cmH20 INTAKE / OUTPUT: Intake/Output   None     PHYSICAL EXAMINATION: General:  Frail, wasted AAM , sedated on vent Neuro:  NMB HEENT:  PERL 4 mm, No LAN/JVD Cardiovascular:  HSR Lungs:  Decreased bs left base Abdomen:  decreasd bs soft Musculoskeletal:  intact Skin:  Cool  LABS:  CBC  Recent Labs Lab 05/03/2013 1250  WBC 4.8  HGB 9.9*  HCT 34.3*  PLT 381   Coag's  Recent Labs Lab 05/05/2013 1335   APTT 41*  INR 1.66*   BMET  Recent Labs Lab 05/19/2013 1250  NA 164*  K 6.3*  CL 128*  CO2 21  BUN 74*  CREATININE 5.42*  GLUCOSE 126*   Electrolytes  Recent Labs Lab 05/25/2013 1250 05/05/2013 1335  CALCIUM 7.8*  --   MG  --  2.2  PHOS  --  1.9*   Sepsis Markers  Recent Labs Lab 05/21/2013 1227 05/20/2013 1253 05/26/2013 1335  LATICACIDVEN 7.53* 5.12* 4.1*  PROCALCITON  --   --  <0.10   ABG  Recent Labs Lab 05/15/2013 1435  PHART 7.289*  PCO2ART 40.0  PO2ART 282.0*   Liver Enzymes  Recent Labs Lab 05/13/2013 1250  AST 244*  ALT 149*  ALKPHOS 68  BILITOT 0.6  ALBUMIN 2.2*   Cardiac Enzymes No results found for this basename: TROPONINI, PROBNP,  in the last 168 hours Glucose No results found for this basename: GLUCAP,  in the last 168 hours  Imaging Dg Chest Port 1 View  (if Code Sepsis Called)  05/13/2013   CLINICAL DATA:  Hypoxia  EXAM: PORTABLE CHEST - 1 VIEW  COMPARISON:  September 15, 2012  FINDINGS: Endotracheal tube tip is 3.3 cm above the carina.  No pneumothorax.  There is left lower lobe consolidation. Lungs elsewhere are clear. Heart size and pulmonary vascularity are within normal limits. No adenopathy.  IMPRESSION: Endotracheal tube as described without pneumothorax. Left lower lobe consolidation.   Electronically Signed   By: Lowella Grip M.D.   On: 04/30/2013 13:16    CXR:  Endotracheal tube tip is 3.3 cm above the carina. No pneumothorax.  There is left lower lobe consolidation. Lungs elsewhere are clear.  Heart size and pulmonary vascularity are within normal limits. No  adenopathy   ASSESSMENT / PLAN:  PULMONARY A: VDRF from sepsis P:   Check c x r  Abg Place a line per rt.  CARDIOVASCULAR A: Shock, presumed septic shock CHF P:  Fluid resuscitation; CVC is a femoral line, cannot follow CVP Abx Pressors if needed  Send troponins   RENAL A:  Acute on chronic renal failure Hyperkalemia  P:   Fluids Repeat BMP pm  3/2  GASTROINTESTINAL A:  GI protection P:   ppi  HEMATOLOGIC A:  Prostate cancer P:  Obtain cancer records  INFECTIOUS A:  Presumed sepsis of unknown source but urinary prime consideration P:   Empiric broad spectrum abx  ENDOCRINE A:  No   Acute issue P:   Follow CBGs  NEUROLOGIC A:  AMS, presumed ams from sepsis P:   Monitor in ICU CT head to rule out acute injury or mets to brain.   TODAY'S SUMMARY: 3/2  1400 spoke  with wife Zavon Hyson. She requests LCB , no shock or CPR.  60 minutes CC time  [facility] ACNP Maryanna Shape PCCM Pager [phone] till 3 pm If no answer page [phone] 04/30/2013, 3:32 PM  Baltazar Apo, MD, PhD 05/04/2013, 3:32 PM [facility] [phone] or if no answer [phone]

## 2013-05-01 NOTE — ED Notes (Signed)
Critical care speaking with family.

## 2013-05-01 NOTE — ED Notes (Signed)
BP in 67E systolic, critcal care informed, bolus ordered verbal order.

## 2013-05-01 NOTE — ED Notes (Signed)
Respiratory at bedside.

## 2013-05-01 NOTE — ED Notes (Signed)
Bed: RESA Expected date:  Expected time:  Means of arrival:  Comments: ems- altered, fever, prostate cancer

## 2013-05-01 NOTE — Progress Notes (Signed)
ANTIBIOTIC CONSULT NOTE - INITIAL  Pharmacy Consult for Zosyn, vancomycin Indication: septic shock  Allergies  Allergen Reactions  . Ace Inhibitors Other (See Comments)    Hyperkalemia  . Lipitor [Atorvastatin] Other (See Comments)    Myalgias    Patient Measurements: Height: 5\' 11"  (180.3 cm) Weight: 180 lb (81.647 kg) IBW/kg (Calculated) : 75.3   Vital Signs: Temp: 102.5 F (39.2 C) (03/02 1316) Temp src: Rectal (03/02 1316) BP: 99/58 mmHg (03/02 1315) Pulse Rate: 91 (03/02 1315) Intake/Output from previous day:   Intake/Output from this shift:    Labs:  Recent Labs  05/26/2013 1250  WBC PENDING  HGB 9.9*  PLT 381  CREATININE 5.42*   Estimated Creatinine Clearance: 11 ml/min (by C-G formula based on Cr of 5.42). No results found for this basename: VANCOTROUGH, VANCOPEAK, VANCORANDOM, GENTTROUGH, GENTPEAK, GENTRANDOM, TOBRATROUGH, TOBRAPEAK, TOBRARND, AMIKACINPEAK, AMIKACINTROU, AMIKACIN,  in the last 72 hours   Microbiology: No results found for this or any previous visit (from the past 720 hour(s)).  Medical History: Past Medical History  Diagnosis Date  . Dementia   . Renal disorder   . Hypertension   . Onychomycosis [id number]  . History of prostate cancer 2003    Treated with external beam radiation  . Cancer   . Hyperlipidemia   . History of BPH   . History of lower GI bleeding 2000  . CHF (congestive heart failure)     Medications:  Scheduled:  . fentaNYL  50 mcg Intravenous Once  . pantoprazole (PROTONIX) IV  40 mg Intravenous QHS   Infusions:  . sodium chloride    . fentaNYL infusion INTRAVENOUS    . norepinephrine (LEVOPHED) Adult infusion    . sodium chloride 1,000 mL (04/30/2013 1330)  . vancomycin 1,000 mg (05/23/2013 1255)   PRN: acetaminophen  Assessment 80 y/o M with prostate cancer was brought to ED with altered mental status, hypotension, elevated lactate.  Code sepsis was called and empiric Zosyn and vancomycin were started.   Zosyn 3.375 grams x 1 and vancomycin 1 gram x 1 were administered, and pharmacy dosing assistance was requested for maintenance dosing.  Patient is currently in acute renal failure (SCr 5.4, estimated CrCl 11 mL/min), on ventilator, and requiring vasopressor infusion (norepinephrine).   Goal of Therapy:  Appropriate antibiotic dosing for renal function; eradication of infection Vancomycin trough 15-20  Plan:  1. Zosyn 2.25 grams IV q6h, next dose due 6pm. 2. Vancomycin random level tomorrow AM to guide further dosing. 3. Follow serum creatinine, UOP, culture data, clinical course.  Clayburn Pert, PharmD, BCPS Pager: [phone]   1:59 PM

## 2013-05-01 NOTE — Progress Notes (Signed)
eLink Physician-Brief Progress Note Patient Name: Cody [name] DOB: 07-22-1927 MRN: [id number]  Date of Service  05/28/2013   HPI/Events of Note   NSTEMI, trop 20, diffuse prior T wave flup, deep, faily not wishing heroics  eICU Interventions  Add asa, hep, bp would not allow bb yet   Intervention Category Major Interventions: Arrhythmia - evaluation and management  Lousie Calico J. 05/13/2013, 7:31 PM

## 2013-05-01 NOTE — ED Notes (Signed)
Critical care at bedside  

## 2013-05-01 NOTE — ED Notes (Signed)
2 NS 1024ml bolus infusing at present

## 2013-05-01 NOTE — Progress Notes (Signed)
eLink Physician-Brief Progress Note Patient Name: Cody [name] DOB: 08/13/1927 MRN: [id number]  Date of Service  04/30/2013   HPI/Events of Note   k 5.8  eICU Interventions  Add bicarb kayxlate x 1 Follow up bmet   Intervention Category Major Interventions: Electrolyte abnormality - evaluation and management  Raylene Miyamoto. 05/03/2013, 8:36 PM

## 2013-05-01 NOTE — ED Notes (Signed)
Pt starting to bite ETT tube. Pt otherwise unresponsive. Critical care informed.

## 2013-05-01 NOTE — Sedation Documentation (Signed)
20mg  etomidate given followed by 100mg  succinylcholline. MD using BVM to ventilate.

## 2013-05-01 NOTE — Progress Notes (Signed)
ANTICOAGULATION CONSULT NOTE - Initial Consult  Pharmacy Consult for Heparin Indication: ACS / STEMI  Allergies  Allergen Reactions  . Ace Inhibitors Other (See Comments)    Hyperkalemia  . Lipitor [Atorvastatin] Other (See Comments)    Myalgias    Patient Measurements: Height: 5\' 11"  (180.3 cm) Weight: 151 lb 0.2 oz (68.5 kg) IBW/kg (Calculated) : 75.3 Heparin Dosing Weight: 68.5 kg  Vital Signs: Temp: 98.2 F (36.8 C) (03/02 1900) Temp src: Core (Comment) (03/02 1600) BP: 94/39 mmHg (03/02 1900) Pulse Rate: 72 (03/02 1900)  Labs:  Recent Labs  05/09/2013 1250  1335 05/30/2013 1717  HGB 9.9*  --   --   HCT 34.3*  --   --   PLT 381  --   --   APTT  --  41*  --   LABPROT  --  19.1*  --   INR  --  1.66*  --   CREATININE 5.42*  --  5.54*  TROPONINI  --   --  >20.00*    Estimated Creatinine Clearance: 9.8 ml/min (by C-G formula based on Cr of 5.54).   Medical History: Past Medical History  Diagnosis Date  . Dementia   . Renal disorder   . Hypertension   . Onychomycosis [id number]  . History of prostate cancer 2003    Treated with external beam radiation  . Cancer   . Hyperlipidemia   . History of BPH   . History of lower GI bleeding 2000  . CHF (congestive heart failure)     Medications:  Scheduled:  . aspirin  300 mg Rectal Daily  . pantoprazole (PROTONIX) IV  40 mg Intravenous QHS  . piperacillin-tazobactam (ZOSYN)  IV  2.25 g Intravenous 4 times per day   Infusions:  . sodium chloride    . fentaNYL infusion INTRAVENOUS 50 mcg/hr (05/15/2013 1753)  . norepinephrine (LEVOPHED) Adult infusion 10 mcg/min (05/16/2013 1520)    Assessment: 80 yo male with prostate cancer, known to Pharmacy from antibiotic protocols. Now to begin IV heparin for troponin >20 and EKG changes.  Dosing weight = actual body weight 68.5kg  Baseline INR elevated at 1.66 - no anticoagulants PTA  Hgb low 9.9 (baseline ~10-11 per Epic review), Plt normal  Acute on chronic  renal failure, CrCl ~ 10 ml/min  Goal of Therapy:  Heparin level 0.3-0.7 units/ml Monitor platelets by anticoagulation protocol: Yes   Plan:   No heparin bolus due to elevated INR  Heparin infusion 1100 units/hr  Heparin level 8hrs after starting due to age, renal function  Daily heparin level and CBC  Peggyann Juba, PharmD, BCPS Pager: [phone] 05/02/2013,7:40 PM

## 2013-05-02 ENCOUNTER — Inpatient Hospital Stay (HOSPITAL_COMMUNITY): Payer: Medicare Other

## 2013-05-02 DIAGNOSIS — J9600 Acute respiratory failure, unspecified whether with hypoxia or hypercapnia: Secondary | ICD-10-CM

## 2013-05-02 DIAGNOSIS — N19 Unspecified kidney failure: Secondary | ICD-10-CM

## 2013-05-02 DIAGNOSIS — R6521 Severe sepsis with septic shock: Secondary | ICD-10-CM

## 2013-05-02 DIAGNOSIS — A419 Sepsis, unspecified organism: Secondary | ICD-10-CM

## 2013-05-02 DIAGNOSIS — R6520 Severe sepsis without septic shock: Secondary | ICD-10-CM

## 2013-05-02 DIAGNOSIS — F0390 Unspecified dementia without behavioral disturbance: Secondary | ICD-10-CM

## 2013-05-02 LAB — HEPARIN LEVEL (UNFRACTIONATED)
Heparin Unfractionated: 0.5 IU/mL (ref 0.30–0.70)
Heparin Unfractionated: 0.87 IU/mL — ABNORMAL HIGH (ref 0.30–0.70)

## 2013-05-02 LAB — BLOOD GAS, ARTERIAL
Acid-base deficit: 6.6 mmol/L — ABNORMAL HIGH (ref 0.0–2.0)
Bicarbonate: 17.3 mEq/L — ABNORMAL LOW (ref 20.0–24.0)
DRAWN BY: 317871
FIO2: 0.4 %
LHR: 14 {breaths}/min
O2 SAT: 96.7 %
PCO2 ART: 31.1 mmHg — AB (ref 35.0–45.0)
PEEP: 5 cmH2O
Patient temperature: 98.6
TCO2: 16 mmol/L (ref 0–100)
VT: 500 mL
pH, Arterial: 7.365 (ref 7.350–7.450)
pO2, Arterial: 101 mmHg — ABNORMAL HIGH (ref 80.0–100.0)

## 2013-05-02 LAB — CBC
HEMATOCRIT: 34.9 % — AB (ref 39.0–52.0)
HEMATOCRIT: 30.2 % — AB (ref 39.0–52.0)
Hemoglobin: 10.8 g/dL — ABNORMAL LOW (ref 13.0–17.0)
Hemoglobin: 9.7 g/dL — ABNORMAL LOW (ref 13.0–17.0)
MCH: 24.9 pg — ABNORMAL LOW (ref 26.0–34.0)
MCH: 25.4 pg — AB (ref 26.0–34.0)
MCHC: 30.9 g/dL (ref 30.0–36.0)
MCHC: 32.1 g/dL (ref 30.0–36.0)
MCV: 80.6 fL (ref 78.0–100.0)
MCV: 79.1 fL (ref 78.0–100.0)
PLATELETS: 370 10*3/uL (ref 150–400)
Platelets: 266 10*3/uL (ref 150–400)
RBC: 4.33 MIL/uL (ref 4.22–5.81)
RBC: 3.82 MIL/uL — AB (ref 4.22–5.81)
RDW: 24.2 % — ABNORMAL HIGH (ref 11.5–15.5)
RDW: 23.8 % — ABNORMAL HIGH (ref 11.5–15.5)
WBC: 5 10*3/uL (ref 4.0–10.5)
WBC: 5.4 10*3/uL (ref 4.0–10.5)

## 2013-05-02 LAB — BASIC METABOLIC PANEL
BUN: 71 mg/dL — ABNORMAL HIGH (ref 6–23)
CHLORIDE: 127 meq/L — AB (ref 96–112)
CO2: 19 meq/L (ref 19–32)
CREATININE: 5.48 mg/dL — AB (ref 0.50–1.35)
Calcium: 7.3 mg/dL — ABNORMAL LOW (ref 8.4–10.5)
GFR calc Af Amer: 10 mL/min — ABNORMAL LOW (ref >90)
GFR calc non Af Amer: 9 mL/min — ABNORMAL LOW (ref >90)
Glucose, Bld: 102 mg/dL — ABNORMAL HIGH (ref 70–99)
POTASSIUM: 5.4 meq/L — AB (ref 3.7–5.3)
Sodium: 161 mEq/L — ABNORMAL HIGH (ref 137–147)

## 2013-05-02 LAB — TROPONIN I: Troponin I: >20 ng/mL (ref <0.30)

## 2013-05-02 LAB — LEGIONELLA ANTIGEN, URINE: Legionella Antigen, Urine: NEGATIVE

## 2013-05-02 LAB — GLUCOSE, CAPILLARY
Glucose-Capillary: 77 mg/dL (ref 70–99)
Glucose-Capillary: 83 mg/dL (ref 70–99)

## 2013-05-02 LAB — URINE CULTURE
Colony Count: NO GROWTH
Culture: NO GROWTH

## 2013-05-02 LAB — VANCOMYCIN, RANDOM: VANCOMYCIN RM: 14.8 ug/mL

## 2013-05-02 MED ORDER — VANCOMYCIN HCL IN DEXTROSE 1-5 GM/200ML-% IV SOLN
1000.0000 mg | Freq: Once | INTRAVENOUS | Status: AC
  Administered 2013-05-02: 1000 mg via INTRAVENOUS
  Filled 2013-05-02: qty 200

## 2013-05-02 MED ORDER — VITAL 1.5 CAL PO LIQD
1000.0000 mL | ORAL | Status: DC
Start: 2013-05-02 — End: 2013-05-03
  Administered 2013-05-02: 2000 mL
  Filled 2013-05-02 (×3): qty 1000

## 2013-05-02 NOTE — Progress Notes (Signed)
INITIAL NUTRITION ASSESSMENT  DOCUMENTATION CODES Per approved criteria  -Not Applicable   INTERVENTION: - Start TF via OGT of Vital 1.5 at 79ml/hr increase by 68ml every 4 hours to goal of 37ml/hr. Goal rate will provide 2160 calories, 97g protein, 1148ml free water and meet 103% estimated calorie needs and 100% estimated protein needs. If IVF d/c, recommend 140ml water flushes 6 times/day.  - Start adult enteral protocol - Will continue to monitor  NUTRITION DIAGNOSIS: Inadequate oral intake related to inability to eat as evidenced by NPO, mechanical ventilation.   Goal: TF to meet >90% of estimated nutritional needs  Monitor:  Weights, labs, TF tolerance/advancement, vent status  Reason for Assessment: Ventilated pt, low braden  80 y.o. male  Admitting Dx: Sepsis/shock  ASSESSMENT: Pt with known prostate cancer whom family had been checking on hospice evaluation. Brought to ED with decreased mental status, progressive over ~4 days. Noted to be hypotensive, required urgent intubation and fluid resuscitation in the emergency room. No family present at this time. Received verbal order from NP to start/manage TF.   Patient is currently intubated on ventilator support.  MV: 13 L/min Temp (24hrs), Avg:99 F (37.2 C), Min:97.7 F (36.5 C), Max:102.5 F (39.2 C)  Propofol: off  Sodium elevated Potassium elevated BUN/Cr significantly elevated with very low GFR Phosphorus low AST/ALT elevated   Height: Ht Readings from Last 1 Encounters:  05/20/2013 5\' 11"  (1.803 m)    Weight: Wt Readings from Last 1 Encounters:  05/02/13 158 lb 1.1 oz (71.7 kg)    Ideal Body Weight: 172 lb   % Ideal Body Weight: 92%  Wt Readings from Last 10 Encounters:  05/02/13 158 lb 1.1 oz (71.7 kg)    Usual Body Weight: Unable to assess    BMI:  Body mass index is 22.06 kg/(m^2).  Estimated Nutritional Needs: Kcal: 2089 Protein: 83-103g Fluid: >2L/day  Skin: Intact   Diet Order:  NPO  EDUCATION NEEDS: -No education needs identified at this time   Intake/Output Summary (Last 24 hours) at 05/02/13 0957 Last data filed at 05/02/13 0900  Gross per 24 hour  Intake 3854.58 ml  Output    445 ml  Net 3409.58 ml    Last BM: 3/2  Labs:   Recent Labs Lab 05/25/2013 1250 05/22/2013 1335 05/14/2013 1717 05/26/2013 2100 05/02/13 0330  NA 164*  --  165* 163* 161*  K 6.3*  --  5.8* 5.4* 5.4*  CL 128*  --  >130* >130* 127*  CO2 21  --  19 17* 19  BUN 74*  --  70* 68* 71*  CREATININE 5.42*  --  5.54* 5.49* 5.48*  CALCIUM 7.8*  --  7.5* 7.1* 7.3*  MG  --  2.2  --   --   --   PHOS  --  1.9*  --   --   --   GLUCOSE 126*  --  133* 113* 102*    CBG (last 3)  No results found for this basename: GLUCAP,  in the last 72 hours  Scheduled Meds: . antiseptic oral rinse  15 mL Mouth Rinse QID  . aspirin  300 mg Rectal Daily  . chlorhexidine  15 mL Mouth Rinse BID  . pantoprazole (PROTONIX) IV  40 mg Intravenous QHS  . piperacillin-tazobactam (ZOSYN)  IV  2.25 g Intravenous 4 times per day    Continuous Infusions: . sodium chloride 75 mL/hr at 05/30/2013 2104  . fentaNYL infusion INTRAVENOUS 50 mcg/hr (05/13/2013 2200)  .  heparin 1,100 Units/hr (05/02/13 0600)  . norepinephrine (LEVOPHED) Adult infusion 6 mcg/min (05/02/13 0825)  .  sodium bicarbonate  infusion 1000 mL 50 mL/hr at 05-04-2013 2112    Past Medical History  Diagnosis Date  . Dementia   . Renal disorder   . Hypertension   . Onychomycosis [id number]  . History of prostate cancer 2003    Treated with external beam radiation  . Cancer   . Hyperlipidemia   . History of BPH   . History of lower GI bleeding 2000  . CHF (congestive heart failure)     History reviewed. No pertinent past surgical history.  Mikey College MS, [place], [place] Pager [phone] After Hours Pager

## 2013-05-02 NOTE — Progress Notes (Signed)
Name: Cody [name] MRN: [id number] DOB: 09-27-1927    ADMISSION DATE:  05/15/2013   REFERRING MD :  EDP PRIMARY SERVICE: PCCM  CHIEF COMPLAINT:  Sepsis / shock  BRIEF PATIENT DESCRIPTION:  80 yo NHP with known prostate cancer admitted with AMS, shock, presumed sepsis, VDRF. Limited Code status  SIGNIFICANT EVENTS / STUDIES:  3/2 ++ trop placed on heparin drip  LINES / TUBES: 3/2 ott>> 3/2 rt fem cvl>>  CULTURES: 3/2 sputum>>GPC clusters>> 3/2 bc x 2>>GPC clusters>> 3/2 uc>>GPC clusters>>  ANTIBIOTICS: 3/2 vanc>> 3/2 pip-tazo>>  HISTORY OF PRESENT ILLNESS:   80 yo NHP with known prostate cancer whom family had been checking on hospice evaluation. Brought to ED with decreased MS, progressive over ~4 days. Noted to be hypotensive, lactic acid of 5,  required urgent intubation and fluid resuscitation in the emergency room.  No family were available for consultation. PCCM asked to admit.  SUBJECTIVE:  Non verbal, new dark stools on heparin drip  VITAL SIGNS: Temp:  [97.7 F (36.5 C)-102.5 F (39.2 C)] 100 F (37.8 C) (03/03 0930) Pulse Rate:  [55-112] 84 (03/03 0930) Resp:  [13-31] 21 (03/03 0930) BP: (70-143)/(16-101) 108/58 mmHg (03/03 0930) SpO2:  [87 %-100 %] 100 % (03/03 0930) FiO2 (%):  [35 %-100 %] 35 % (03/03 0900) Weight:  [68.5 kg (151 lb 0.2 oz)-81.647 kg (180 lb)] 71.7 kg (158 lb 1.1 oz) (03/03 0100) HEMODYNAMICS:   VENTILATOR SETTINGS: Vent Mode:  [-] PRVC FiO2 (%):  [35 %-100 %] 35 % Set Rate:  [14 bmp] 14 bmp Vt Set:  [500 mL] 500 mL PEEP:  [5 cmH20] 5 cmH20 Plateau Pressure:  [18 cmH20-23 cmH20] 21 cmH20 INTAKE / OUTPUT: Intake/Output     03/02 0701 - 03/03 0700 03/03 0701 - 03/04 0700   I.V. (mL/kg) 3116.8 (43.5) 297.8 (4.2)   NG/GT 60 30   IV Piggyback 150 200   Total Intake(mL/kg) 3326.8 (46.4) 527.8 (7.4)   Urine (mL/kg/hr) 170 125 (0.5)   Emesis/NG output 150    Total Output 320 125   Net +3006.8 +402.8        Stool  Occurrence 2 x      PHYSICAL EXAMINATION: General:  Frail, wasted AAM ,  on vent Neuro:  No follows commands HEENT:  PERL 4 mm, No LAN/JVD Cardiovascular:  HSR Lungs:  Decreased bs left base Abdomen:  decreasd bs soft Musculoskeletal:  intact Skin:  Cool  LABS:  CBC  Recent Labs Lab 05/21/2013 1250 05/02/13 0330  WBC 4.8 5.0  HGB 9.9* 10.8*  HCT 34.3* 34.9*  PLT 381 370   Coag's  Recent Labs Lab 05/04/2013 1335  APTT 41*  INR 1.66*   BMET  Recent Labs Lab  1717 05/15/2013 2100 05/02/13 0330  NA 165* 163* 161*  K 5.8* 5.4* 5.4*  CL >130* >130* 127*  CO2 19 17* 19  BUN 70* 68* 71*  CREATININE 5.54* 5.49* 5.48*  GLUCOSE 133* 113* 102*   Electrolytes  Recent Labs Lab 04/30/2013 1250 05/12/2013 1335 05/29/2013 1717 05/18/2013 2100 05/02/13 0330  CALCIUM 7.8*  --  7.5* 7.1* 7.3*  MG  --  2.2  --   --   --   PHOS  --  1.9*  --   --   --    Sepsis Markers  Recent Labs Lab 05/18/2013 1227 05/25/2013 1253 05/24/2013 1335  LATICACIDVEN 7.53* 5.12* 4.1*  PROCALCITON  --   --  <0.10   ABG  Recent Labs Lab 05/12/2013 1435 05/02/13 0339  PHART 7.289* 7.365  PCO2ART 40.0 31.1*  PO2ART 282.0* 101.0*   Liver Enzymes  Recent Labs Lab 12-May-2013 1250  AST 244*  ALT 149*  ALKPHOS 68  BILITOT 0.6  ALBUMIN 2.2*   Cardiac Enzymes  Recent Labs Lab May 12, 2013 1717 May 12, 2013 2100 05/02/13 0330  TROPONINI >20.00* >20.00* >20.00*   Glucose No results found for this basename: GLUCAP,  in the last 168 hours  Imaging Dg Chest Port 1 View  05/02/2013   CLINICAL DATA:  Endotracheal tube position  EXAM: PORTABLE CHEST - 1 VIEW  COMPARISON:  05-12-2013  FINDINGS: Endotracheal tube in good position.  NG tube in the stomach.  Mild improvement in bibasilar atelectasis.  No edema or effusion.  IMPRESSION: Endotracheal tube in good position. Mild bibasilar atelectasis shows interval improvement.   Electronically Signed   By: Franchot Gallo M.D.   On: 05/02/2013 07:16    Dg Chest Port 1 View  (if Code Sepsis Called)  2013/05/12   CLINICAL DATA:  Hypoxia  EXAM: PORTABLE CHEST - 1 VIEW  COMPARISON:  September 15, 2012  FINDINGS: Endotracheal tube tip is 3.3 cm above the carina.  No pneumothorax.  There is left lower lobe consolidation. Lungs elsewhere are clear. Heart size and pulmonary vascularity are within normal limits. No adenopathy.  IMPRESSION: Endotracheal tube as described without pneumothorax. Left lower lobe consolidation.   Electronically Signed   By: Lowella Grip M.D.   On: 2013-05-12 13:16     ASSESSMENT / PLAN:  PULMONARY A: VDRF from sepsis P:   Follow  c x r  Abg Assess SBT when/if medically stable   CARDIOVASCULAR A: Shock, presumed septic shock but +++ trop I CHF NSTEMI (presumed stress due to infxn) P:  Fluid resuscitation; CVC is a femoral line, cannot follow CVP Abx Pressors if needed  Send troponins >20 on heparin drip but with bleeding complications > heparin d/c'd 3/3  RENAL Lab Results  Component Value Date   CREATININE 5.48* 05/02/2013   CREATININE 5.49* 12-May-2013   CREATININE 5.54* May 12, 2013    Recent Labs Lab 05-12-13 1717 May 12, 2013 2100 05/02/13 0330  K 5.8* 5.4* 5.4*   A:  Acute on chronic oliguric renal failure Hyperkalemia  P:   IV Fluids Follow UOP but Refractory to interventions thus far Not HD candidate   GASTROINTESTINAL A:  GI protection P:   ppi  HEMATOLOGIC  Recent Labs  05-12-13 1250 05/02/13 0330  HGB 9.9* 10.8*    A:  Prostate cancer      Bleeding complications from heparin drip P:  Obtain cancer records Trend H/H  INFECTIOUS A:  UTI with GPC bacteremia P:   Empiric broad spectrum abx ordered  ENDOCRINE A:  No   Acute issue P:   Follow CBGs  NEUROLOGIC A:  AMS, presumed ams from sepsis P:   Monitor in ICU CT head to rule out acute injury or mets to brain.   TODAY'S SUMMARY: 3/2  1400 spoke with wife Cody [name]. She requests LCB , no shock or CPR. 3/3 NSTEMI  in NHP with dementia, non verbal, bleeding complications, worsening renal failure, hyperkalemia all consistent with MODS, LCB. DC heparin Consider more focus on comfort rather than curative. Discussed with wife and her sister 3/3. We will continue to consider withdrawal of care. Need to also speak to his niece and HCPOA.   40 min CC time  Richardson Landry Minor ACNP Maryanna Shape PCCM Pager [phone] till 3  pm If no answer page [phone] 05/02/2013, 10:27 AM  Baltazar Apo, MD, PhD 05/02/2013, 11:56 AM  Pulmonary and Critical Care [phone] or if no answer [phone]

## 2013-05-02 NOTE — Progress Notes (Signed)
CARE MANAGEMENT NOTE 05/02/2013  Patient:  MAHKAI, FANGMAN   Account Number:  [id number]  Date Initiated:  05/02/2013  Documentation initiated by:  Dollie Bressi  Subjective/Objective Assessment:   80 yo NHP with known prostate cancer whom family had been checking on hospice evaluation. Brought to ED with decreased MS,progressive over ~4 days. Noted to be hypotensive, lactic acid of 5,  required urgent intubation and fluid resuscitati     Action/Plan:   PARTIAL CODE, MORE COMFORT THAN AGGRESSIVE, REMAINS ON VENT.  Patient may require terminal extubation or ltach   Anticipated DC Date:  05/05/2013   Anticipated DC Plan:  [place]  In-house referral  NA      DC Planning Services  NA      [place] Choice  NA   Choice offered to / List presented to:  NA   DME arranged  NA      DME agency  NA     [place] arranged  NA      [place] agency  NA   Status of service:  In process, will continue to follow Medicare Important Message given?  NA - LOS <3 / Initial given by admissions (If response is "NO", the following Medicare IM given date fields will be blank) Date Medicare IM given:   Date Additional Medicare IM given:    Discharge Disposition:    Per UR Regulation:  Reviewed for med. necessity/level of care/duration of stay  If discussed at [place] of Stay Meetings, dates discussed:    Comments:  [id number]/Jessalynn Mccowan Eldridge Dace, BSN, [place] [phone] Chart Reviewed for discharge and hospital needs. Discharge needs at time of review:  None present will follow for needs. Review of patient progress due on [id number].

## 2013-05-02 NOTE — Progress Notes (Signed)
ANTICOAGULATION CONSULT NOTE - F/U Consult  Pharmacy Consult for Heparin Indication: ACS / STEMI  Allergies  Allergen Reactions  . Ace Inhibitors Other (See Comments)    Hyperkalemia  . Lipitor [Atorvastatin] Other (See Comments)    Myalgias    Patient Measurements: Height: 5\' 11"  (180.3 cm) Weight: 158 lb 1.1 oz (71.7 kg) IBW/kg (Calculated) : 75.3 Heparin Dosing Weight: 68.5 kg  Vital Signs: Temp: 99.5 F (37.5 C) (03/03 0530) Temp src: Core (Comment) (03/03 0445) BP: 131/45 mmHg (03/03 0530) Pulse Rate: 76 (03/03 0530)  Labs:  Recent Labs   1250 05/28/2013 1335 05/04/2013 1717 05/10/2013 2100 05/02/13 0330  HGB 9.9*  --   --   --  10.8*  HCT 34.3*  --   --   --  34.9*  PLT 381  --   --   --  370  APTT  --  41*  --   --   --   LABPROT  --  19.1*  --   --   --   INR  --  1.66*  --   --   --   HEPARINUNFRC  --   --   --   --  0.50  CREATININE 5.42*  --  5.54* 5.49* 5.48*  TROPONINI  --   --  >20.00* >20.00* >20.00*    Estimated Creatinine Clearance: 10.4 ml/min (by C-G formula based on Cr of 5.48).   Medical History: Past Medical History  Diagnosis Date  . Dementia   . Renal disorder   . Hypertension   . Onychomycosis [id number]  . History of prostate cancer 2003    Treated with external beam radiation  . Cancer   . Hyperlipidemia   . History of BPH   . History of lower GI bleeding 2000  . CHF (congestive heart failure)     Medications:  Scheduled:  . antiseptic oral rinse  15 mL Mouth Rinse QID  . aspirin  300 mg Rectal Daily  . chlorhexidine  15 mL Mouth Rinse BID  . pantoprazole (PROTONIX) IV  40 mg Intravenous QHS  . piperacillin-tazobactam (ZOSYN)  IV  2.25 g Intravenous 4 times per day   Infusions:  . sodium chloride 75 mL/hr at 05/24/2013 2104  . fentaNYL infusion INTRAVENOUS 50 mcg/hr (05/20/2013 2200)  . heparin 1,100 Units/hr (05/02/13 0500)  . norepinephrine (LEVOPHED) Adult infusion 21 mcg/min (05/02/13 0504)  .  sodium bicarbonate   infusion 1000 mL 50 mL/hr at 05/05/2013 2112    Assessment: 80 yo male with prostate cancer, known to Pharmacy from antibiotic protocols. Now to begin IV heparin for troponin >20 and EKG changes.  Dosing weight = actual body weight 68.5kg  Baseline INR elevated at 1.66 - no anticoagulants PTA  Hgb low 9.9 (baseline ~10-11 per Epic review), Plt normal  Acute on chronic renal failure, CrCl ~ 10 ml/min  1st HL= 0.50- no problems reported per RN  Goal of Therapy:  Heparin level 0.3-0.7 units/ml Monitor platelets by anticoagulation protocol: Yes   Plan:   Continue heparin infusion @ 1100 units/hr  Recheck HL 1300 (confirmatory)  Daily heparin level and CBC   Lawana Pai R 05/02/2013,5:55 AM

## 2013-05-02 NOTE — Progress Notes (Signed)
ANTIBIOTIC CONSULT NOTE - Follow Up  Pharmacy Consult for Vancomycin, Zosyn Indication: septic shock  Allergies  Allergen Reactions  . Ace Inhibitors Other (See Comments)    Hyperkalemia  . Lipitor [Atorvastatin] Other (See Comments)    Myalgias    Patient Measurements: Height: 5\' 11"  (180.3 cm) Weight: 158 lb 1.1 oz (71.7 kg) IBW/kg (Calculated) : 75.3   Vital Signs: Temp: 100.2 F (37.9 C) (03/03 0730) Temp src: Core (Comment) (03/03 0445) BP: 143/78 mmHg (03/03 0730) Pulse Rate: 66 (03/03 0730) Intake/Output from previous day: 03/02 0701 - 03/03 0700 In: 3326.8 [I.V.:3116.8; NG/GT:60; IV Piggyback:150] Out: 170 [Urine:170] Intake/Output from this shift: Total I/O In: 230 [NG/GT:30; IV Piggyback:200] Out: -   Labs:  Recent Labs  04/30/2013 1250 05/04/2013 1717 05/24/2013 2100 05/02/13 0330  WBC 4.8  --   --  5.0  HGB 9.9*  --   --  10.8*  PLT 381  --   --  370  CREATININE 5.42* 5.54* 5.49* 5.48*   Estimated Creatinine Clearance: 10.4 ml/min (by C-G formula based on Cr of 5.48).  Recent Labs  05/02/13 0330  VANCORANDOM 14.8     Microbiology: Recent Results (from the past 720 hour(s))  MRSA PCR SCREENING     Status: None   Collection Time    05/03/2013  3:52 PM      Result Value Ref Range Status   MRSA by PCR NEGATIVE  NEGATIVE Final   Comment:            The GeneXpert MRSA Assay (FDA     approved for NASAL specimens     only), is one component of a     comprehensive MRSA colonization     surveillance program. It is not     intended to diagnose MRSA     infection nor to guide or     monitor treatment for     MRSA infections.    Medical History: Past Medical History  Diagnosis Date  . Dementia   . Renal disorder   . Hypertension   . Onychomycosis [id number]  . History of prostate cancer 2003    Treated with external beam radiation  . Cancer   . Hyperlipidemia   . History of BPH   . History of lower GI bleeding 2000  . CHF (congestive heart  failure)     Medications:  Scheduled:  . antiseptic oral rinse  15 mL Mouth Rinse QID  . aspirin  300 mg Rectal Daily  . chlorhexidine  15 mL Mouth Rinse BID  . pantoprazole (PROTONIX) IV  40 mg Intravenous QHS  . piperacillin-tazobactam (ZOSYN)  IV  2.25 g Intravenous 4 times per day  . vancomycin  1,000 mg Intravenous Once   Infusions:  . sodium chloride 75 mL/hr at 05/25/2013 2104  . fentaNYL infusion INTRAVENOUS 50 mcg/hr (05/18/2013 2200)  . heparin 1,100 Units/hr (05/02/13 0600)  . norepinephrine (LEVOPHED) Adult infusion 14 mcg/min (05/02/13 0735)  .  sodium bicarbonate  infusion 1000 mL 50 mL/hr at 05/13/2013 2112   PRN: acetaminophen  Assessment 80 y/o M with prostate cancer was brought to ED with altered mental status, hypotension, elevated lactate.  Code sepsis was called and empiric Zosyn and vancomycin were started.  Zosyn 3.375 grams x 1 and vancomycin 1 gram x 1 were administered, and pharmacy dosing assistance was requested for maintenance dosing.  Patient is currently in acute renal failure (SCr 5.48, estimated CrCl 10 mL/min, only 170 mL UOP recorded yesterday),  on ventilator, requiring vasopressor infusion (norepinephrine) and on IV heparin infusion for elevated troponins.  Tmax24h = 100.2, WBC WNL.  No culture data available yet.  Random vancomycin level = 14.8, drawn ~14 hours following 1g dose x 1  Goal of Therapy:  Appropriate antibiotic dosing for renal function; eradication of infection Vancomycin trough 15-20  Plan:  1.  Vancomycin 1g IV x 1 this AM.   2.  Will continue to monitor vancomycin levels to guide dosing. 3.  Continue Zosyn 2.25 grams IV q6h. 4.  Follow serum creatinine, UOP, culture data, clinical course.  Hershal Coria, PharmD, BCPS Pager: [phone] 05/02/2013 7:58 AM

## 2013-05-03 ENCOUNTER — Inpatient Hospital Stay (HOSPITAL_COMMUNITY): Payer: Medicare Other

## 2013-05-03 LAB — BASIC METABOLIC PANEL
BUN: 59 mg/dL — AB (ref 6–23)
CALCIUM: 7.3 mg/dL — AB (ref 8.4–10.5)
CO2: 22 meq/L (ref 19–32)
CREATININE: 4.42 mg/dL — AB (ref 0.50–1.35)
Chloride: 120 mEq/L — ABNORMAL HIGH (ref 96–112)
GFR calc Af Amer: 13 mL/min — ABNORMAL LOW (ref >90)
GFR, EST NON AFRICAN AMERICAN: 11 mL/min — AB (ref >90)
GLUCOSE: 109 mg/dL — AB (ref 70–99)
Potassium: 4 mEq/L (ref 3.7–5.3)
Sodium: 155 mEq/L — ABNORMAL HIGH (ref 137–147)

## 2013-05-03 LAB — CBC
HCT: 28.6 % — ABNORMAL LOW (ref 39.0–52.0)
Hemoglobin: 8.9 g/dL — ABNORMAL LOW (ref 13.0–17.0)
MCH: 24.7 pg — ABNORMAL LOW (ref 26.0–34.0)
MCHC: 31.1 g/dL (ref 30.0–36.0)
MCV: 79.2 fL (ref 78.0–100.0)
Platelets: 250 10*3/uL (ref 150–400)
RBC: 3.61 MIL/uL — AB (ref 4.22–5.81)
RDW: 23.8 % — ABNORMAL HIGH (ref 11.5–15.5)
WBC: 5.8 10*3/uL (ref 4.0–10.5)

## 2013-05-03 LAB — GLUCOSE, CAPILLARY
GLUCOSE-CAPILLARY: 71 mg/dL (ref 70–99)
Glucose-Capillary: 80 mg/dL (ref 70–99)
Glucose-Capillary: 104 mg/dL — ABNORMAL HIGH (ref 70–99)
Glucose-Capillary: 109 mg/dL — ABNORMAL HIGH (ref 70–99)

## 2013-05-03 LAB — PROTIME-INR
INR: 1.99 — ABNORMAL HIGH (ref 0.00–1.49)
Prothrombin Time: 22 seconds — ABNORMAL HIGH (ref 11.6–15.2)

## 2013-05-03 LAB — MAGNESIUM: MAGNESIUM: 1.8 mg/dL (ref 1.5–2.5)

## 2013-05-03 LAB — PHOSPHORUS: Phosphorus: 2.4 mg/dL (ref 2.3–4.6)

## 2013-05-03 LAB — APTT: APTT: 47 s — AB (ref 24–37)

## 2013-05-03 MED ORDER — MORPHINE SULFATE 10 MG/ML IJ SOLN
0.5000 mg/h | INTRAVENOUS | Status: DC
  Administered 2013-05-03 – 2013-05-05 (×2): 1 mg/h via INTRAVENOUS
  Administered 2013-05-05 – 2013-05-06 (×2): 2 mg/h via INTRAVENOUS
  Filled 2013-05-03 (×3): qty 10

## 2013-05-03 NOTE — Progress Notes (Addendum)
Wasted 240 mL of Fentanyl 10 mcg/mL 250 mL bag. Witnessed by Erich Montane, RN.

## 2013-05-03 NOTE — Progress Notes (Signed)
Patient placed on 100% , suctioned trachally and orally. Cuff deflated and patient extubated. Placed on [place] 2lpm. HR93, O2 sat 99% and RR 26. Patient is comatose unable to follow commands or verbalize. Will continue to monitor

## 2013-05-03 NOTE — Progress Notes (Signed)
Calls placed to patients' wife and also to [place] to let them know that Cody [name] was being transferred to room 1321. Nursing report called to receiving RN on third floor. IV therapy notified that pt. Is being transferred to 1321 with right femoral CVC.

## 2013-05-03 NOTE — Progress Notes (Signed)
Name: Cody [name] MRN: [id number] DOB: [birth year]    ADMISSION DATE:  05/19/2013   REFERRING MD :  EDP PRIMARY SERVICE: PCCM  CHIEF COMPLAINT:  Sepsis / shock  BRIEF PATIENT DESCRIPTION:  80 yo NHP with known prostate cancer admitted with AMS, shock, presumed sepsis, VDRF. Limited Code status  SIGNIFICANT EVENTS / STUDIES:  3/2 ++ trop placed on heparin drip 3/3 heparin stopped for bleeding  LINES / TUBES: 3/2 ott>> 3/2 rt fem cvl>>  CULTURES: 3/2 sputum>>GPC clusters>> 3/2 bc x 2>>GPC clusters>>staph>> 3/2 uc>>GPC clusters>>neg  ANTIBIOTICS: 3/2 vanc>> 3/2 pip-tazo>>  HISTORY OF PRESENT ILLNESS:   80 yo NHP with known prostate cancer whom family had been checking on hospice evaluation. Brought to ED with decreased MS, progressive over ~4 days. Noted to be hypotensive, lactic acid of 5,  required urgent intubation and fluid resuscitation in the emergency room.  No family were available for consultation. PCCM asked to admit.  SUBJECTIVE:  Non verbal, off heparin Family has gathered to discuss goals for care  VITAL SIGNS: Temp:  [98.4 F (36.9 C)-100.2 F (37.9 C)] 98.8 F (37.1 C) (03/04 0645) Pulse Rate:  [59-112] 79 (03/04 0645) Resp:  [14-28] 23 (03/04 0645) BP: (76-123)/(33-93) 106/46 mmHg (03/04 0645) SpO2:  [79 %-100 %] 100 % (03/04 0645) FiO2 (%):  [35 %] 35 % (03/04 0308) Weight:  [73.3 kg (161 lb 9.6 oz)] 73.3 kg (161 lb 9.6 oz) (03/04 0400) HEMODYNAMICS:   VENTILATOR SETTINGS: Vent Mode:  [-] PRVC FiO2 (%):  [35 %] 35 % Set Rate:  [14 bmp] 14 bmp Vt Set:  [500 mL] 500 mL PEEP:  [5 cmH20] 5 cmH20 Plateau Pressure:  [20 cmH20-22 cmH20] 22 cmH20 INTAKE / OUTPUT: Intake/Output     03/03 0701 - 03/04 0700 03/04 0701 - 03/05 0700   I.V. (mL/kg) 2236.3 (30.5)    NG/GT 120    IV Piggyback 400    Total Intake(mL/kg) 2756.3 (37.6)    Urine (mL/kg/hr) 1175 (0.7)    Emesis/NG output 50 (0)    Total Output 1225     Net +1531.3          Stool  Occurrence 1 x      PHYSICAL EXAMINATION: General:  Frail, wasted AAM ,  on vent Neuro:  No follows commands HEENT:  PERL 4 mm, No LAN/JVD Cardiovascular:  HSR Lungs:  Decreased bs left base Abdomen:  decreasd bs soft Musculoskeletal:  intact Skin:  Cool  LABS:  CBC  Recent Labs Lab 05/02/13 0330 05/02/13 1140 05/03/13 0249  WBC 5.0 5.4 5.8  HGB 10.8* 9.7* 8.9*  HCT 34.9* 30.2* 28.6*  PLT 370 266 250   Coag's  Recent Labs Lab 04/30/2013 1335 05/03/13 0249  APTT 41* 47*  INR 1.66* 1.99*   BMET  Recent Labs Lab 05/21/2013 2100 05/02/13 0330 05/03/13 0249  NA 163* 161* 155*  K 5.4* 5.4* 4.0  CL >130* 127* 120*  CO2 17* 19 22  BUN 68* 71* 59*  CREATININE 5.49* 5.48* 4.42*  GLUCOSE 113* 102* 109*   Electrolytes  Recent Labs Lab 05/28/2013 1250 05/02/2013 1335  05/27/2013 2100 05/02/13 0330 05/03/13 0249  CALCIUM 7.8*  --   < > 7.1* 7.3* 7.3*  MG  --  2.2  --   --   --  1.8  PHOS  --  1.9*  --   --   --  2.4  < > = values in this interval not displayed. Sepsis  Markers  Recent Labs Lab 05/27/2013 1227 05/22/2013 1253 05/26/2013 1335  LATICACIDVEN 7.53* 5.12* 4.1*  PROCALCITON  --   --  <0.10   ABG  Recent Labs Lab 05/16/2013 1435 05/02/13 0339  PHART 7.289* 7.365  PCO2ART 40.0 31.1*  PO2ART 282.0* 101.0*   Liver Enzymes  Recent Labs Lab 05/30/2013 1250  AST 244*  ALT 149*  ALKPHOS 68  BILITOT 0.6  ALBUMIN 2.2*   Cardiac Enzymes  Recent Labs Lab 05/25/2013 1717 05/28/2013 2100 05/02/13 0330  TROPONINI >20.00* >20.00* >20.00*   Glucose  Recent Labs Lab 05/02/13 1304 05/02/13 1607 05/02/13 1925 05/02/13 2334 05/03/13 0328 05/03/13 0734  GLUCAP 77 80 83 71 104* 109*    Imaging Dg Chest Port 1 View  05/03/2013   CLINICAL DATA:  Endotracheal tube position  EXAM: PORTABLE CHEST - 1 VIEW  COMPARISON:  05/02/2013  FINDINGS: Endotracheal tube ends in the mid thoracic trachea. Orogastric tube enters the stomach. Coiling of the orogastric  tube overlapping the head/neck is likely external to the patient given the course of the tubing.  Indistinct basilar opacities, favoring atelectasis. No effusion or pneumothorax. Normal heart size.  IMPRESSION: 1. Unchanged positioning of endotracheal and orogastric tubes, as above. 2. Unchanged bibasilar atelectasis.   Electronically Signed   By: Jorje Guild M.D.   On: 05/03/2013 05:58   Dg Chest Port 1 View  05/02/2013   CLINICAL DATA:  Endotracheal tube position  EXAM: PORTABLE CHEST - 1 VIEW  COMPARISON:  05/13/2013  FINDINGS: Endotracheal tube in good position.  NG tube in the stomach.  Mild improvement in bibasilar atelectasis.  No edema or effusion.  IMPRESSION: Endotracheal tube in good position. Mild bibasilar atelectasis shows interval improvement.   Electronically Signed   By: Franchot Gallo M.D.   On: 05/02/2013 07:16   Dg Chest Port 1 View  (if Code Sepsis Called)  05/20/2013   CLINICAL DATA:  Hypoxia  EXAM: PORTABLE CHEST - 1 VIEW  COMPARISON:  September 15, 2012  FINDINGS: Endotracheal tube tip is 3.3 cm above the carina.  No pneumothorax.  There is left lower lobe consolidation. Lungs elsewhere are clear. Heart size and pulmonary vascularity are within normal limits. No adenopathy.  IMPRESSION: Endotracheal tube as described without pneumothorax. Left lower lobe consolidation.   Electronically Signed   By: Lowella Grip M.D.   On: 05/15/2013 13:16     ASSESSMENT / PLAN:  PULMONARY A: VDRF from sepsis P:   Follow  c x r  Abg Assess SBT when/if medically stable   CARDIOVASCULAR A: Shock, presumed septic shock but +++ trop I CHF NSTEMI (presumed stress due to infxn) P:  Fluid resuscitation; CVC is a femoral line, cannot follow CVP Abx Pressors if needed  Send troponins >20 on heparin drip but with bleeding complications > heparin d/c'd 3/3  RENAL Lab Results  Component Value Date   CREATININE 4.42* 05/03/2013   CREATININE 5.48* 05/02/2013   CREATININE 5.49* 04/30/2013     Recent Labs Lab 05/11/2013 2100 05/02/13 0330 05/03/13 0249  K 5.4* 5.4* 4.0   A:  Acute on chronic oliguric renal failure Hyperkalemia  P:   IV Fluids Follow UOP but Refractory to interventions thus far Not HD candidate   GASTROINTESTINAL A:  GI protection P:   ppi  HEMATOLOGIC  Recent Labs  05/02/13 1140 05/03/13 0249  HGB 9.7* 8.9*    A:  Prostate cancer      Bleeding complications from heparin drip P:  Obtain  cancer records Trend H/H  INFECTIOUS A:  UTI with GPC bacteremia P:   Empiric broad spectrum abx ordered  ENDOCRINE A:  No   Acute issue P:   Follow CBGs  NEUROLOGIC A:  AMS, presumed ams from sepsis P:   Monitor in ICU  TODAY'S SUMMARY: 3/2  1400 spoke with wife Gerritt Galentine. She requests LCB , no shock or CPR. 3/3 NSTEMI in NHP with dementia, non verbal, bleeding complications, worsening renal failure, hyperkalemia all consistent with MODS, LCB. DC heparin  Good discussion with patient's family at bedside this am 3/4. Their Doristine Bosworth was present as well. They have agreed that a transition to comfort care would be most appropriate for Mr Degrace. I will place ordered for a low dose morphine gtt, plan for extubation and then for d/c pressors.   40 minutes CC time.   Richardson Landry Minor ACNP Maryanna Shape PCCM Pager [phone] till 3 pm If no answer page [phone] 05/03/2013, 9:14 AM  Baltazar Apo, MD, PhD 05/03/2013, 10:39 AM [place] [facility] [phone] or if no answer [phone]

## 2013-05-04 LAB — CULTURE, BLOOD (ROUTINE X 2)

## 2013-05-04 LAB — CULTURE, RESPIRATORY: SPECIAL REQUESTS: NORMAL

## 2013-05-04 LAB — CULTURE, RESPIRATORY W GRAM STAIN

## 2013-05-04 NOTE — Progress Notes (Signed)
Nutrition Brief Note   Chart reviewed. Pt now transitioning to comfort care.  No further nutrition interventions warranted at this time.  Please re-consult as needed.   Dmarius Reeder F Charice Zuno MS RD LDN Clinical Dietitian Pager:[phone]    

## 2013-05-05 NOTE — Care Management Note (Signed)
Cm spoke with Dr. Lamonte Sakai concerning GIP appropriateness. Per MD patient stable on 05/05/13 to transition to residential hospice. Per MD discussed this disposition with family. Per Md appropriate to contact CSW concerning residential hospice choices.    Venita Lick Chalon Zobrist,MSN,RN [phone]

## 2013-05-05 NOTE — Progress Notes (Signed)
Name: Cody [name] MRN: [id number] DOB: 1927/11/01    ADMISSION DATE:  05/09/2013   REFERRING MD :  EDP PRIMARY SERVICE: PCCM  CHIEF COMPLAINT:  Sepsis / shock  BRIEF PATIENT DESCRIPTION:  80 yo NHP with known prostate cancer admitted with AMS, shock, presumed sepsis, VDRF. Limited Code status  SIGNIFICANT EVENTS / STUDIES:  3/2 ++ trop placed on heparin drip 3/3 heparin stopped for bleeding  LINES / TUBES: 3/2 ott>> 3/4 3/2 rt fem cvl>>  CULTURES: 3/2 sputum>>GPC clusters>> 3/2 bc x 2>>GPC clusters>>staph>> 3/2 uc>>GPC clusters>>neg  ANTIBIOTICS: 3/2 vanc>> 3/4 3/2 pip-tazo>> 3/4  HISTORY OF PRESENT ILLNESS:   80 yo NHP with known prostate cancer whom family had been checking on hospice evaluation. Brought to ED with decreased MS, progressive over ~4 days. Noted to be hypotensive, lactic acid of 5,  required urgent intubation and fluid resuscitation in the emergency room.  No family were available for consultation. PCCM asked to admit.  SUBJECTIVE:  No clinical change. No evidence discomfort  VITAL SIGNS: Temp:  [100.4 F (38 C)-101.2 F (38.4 C)] 101.2 F (38.4 C) (03/06 0434) Pulse Rate:  [109-114] 109 (03/06 0434) Resp:  [18] 18 (03/05 1400) BP: (104-115)/(55-96) 104/55 mmHg (03/06 0434) SpO2:  [84 %-91 %] 84 % (03/06 0434) HEMODYNAMICS:   VENTILATOR SETTINGS:   INTAKE / OUTPUT: Intake/Output     03/05 0701 - 03/06 0700 03/06 0701 - 03/07 0700   I.V. (mL/kg)     NG/GT     Total Intake(mL/kg)     Urine (mL/kg/hr) 875 (0.5) 250 (0.8)   Total Output 875 250   Net -875 -250        Stool Occurrence 2 x      PHYSICAL EXAMINATION: General:  Frail, wasted AAM , comfortable Neuro:  comatose HEENT:  PERL 4 mm, No LAN/JVD Cardiovascular:  HSR Lungs:  Decreased bs left base Abdomen:  decreasd bs soft Musculoskeletal:  intact Skin:  Cool  LABS:  CBC  Recent Labs Lab 05/02/13 0330 05/02/13 1140 05/03/13 0249  WBC 5.0 5.4 5.8  HGB 10.8*  9.7* 8.9*  HCT 34.9* 30.2* 28.6*  PLT 370 266 250   Coag's  Recent Labs Lab 05/12/2013 1335 05/03/13 0249  APTT 41* 47*  INR 1.66* 1.99*   BMET  Recent Labs Lab 05/21/2013 2100 05/02/13 0330 05/03/13 0249  NA 163* 161* 155*  K 5.4* 5.4* 4.0  CL >130* 127* 120*  CO2 17* 19 22  BUN 68* 71* 59*  CREATININE 5.49* 5.48* 4.42*  GLUCOSE 113* 102* 109*   Electrolytes  Recent Labs Lab 05/21/2013 1250 05/27/2013 1335  05/22/2013 2100 05/02/13 0330 05/03/13 0249  CALCIUM 7.8*  --   < > 7.1* 7.3* 7.3*  MG  --  2.2  --   --   --  1.8  PHOS  --  1.9*  --   --   --  2.4  < > = values in this interval not displayed. Sepsis Markers  Recent Labs Lab 05/02/2013 1227 05/18/2013 1253 05/14/2013 1335  LATICACIDVEN 7.53* 5.12* 4.1*  PROCALCITON  --   --  <0.10   ABG  Recent Labs Lab 05/03/2013 1435 05/02/13 0339  PHART 7.289* 7.365  PCO2ART 40.0 31.1*  PO2ART 282.0* 101.0*   Liver Enzymes  Recent Labs Lab 05/02/2013 1250  AST 244*  ALT 149*  ALKPHOS 68  BILITOT 0.6  ALBUMIN 2.2*   Cardiac Enzymes  Recent Labs Lab 05/02/2013 1717 05/29/2013 2100 05/02/13  0330  TROPONINI >20.00* >20.00* >20.00*   Glucose  Recent Labs Lab 05/02/13 1304 05/02/13 1607 05/02/13 1925 05/02/13 2334 05/03/13 0328 05/03/13 0734  GLUCAP 77 80 83 71 104* 109*    Imaging No results found.   ASSESSMENT / PLAN:  PULMONARY A: VDRF from sepsis P:   Extubated for comfort scopolamine for secretion management   CARDIOVASCULAR A: Shock, presumed septic shock but +++ trop I CHF NSTEMI (presumed stress due to infxn) P:  Comfort measures  RENAL Lab Results  Component Value Date   CREATININE 4.42* 05/03/2013   CREATININE 5.48* 05/02/2013   CREATININE 5.49* 05/24/2013    Recent Labs Lab 05/29/2013 2100 05/02/13 0330 05/03/13 0249  K 5.4* 5.4* 4.0   A:  Acute on chronic oliguric renal failure Hyperkalemia  P:   Not HD candidate   HEMATOLOGIC  Recent Labs  05/02/13 1140  05/03/13 0249  HGB 9.7* 8.9*    A:  Prostate cancer      Bleeding complications from heparin drip P:  No evidence bleeding at this time  INFECTIOUS A:  UTI with GPC bacteremia P:   Abx stopped  ENDOCRINE A:  No   Acute issue P:   CBGs stopped  NEUROLOGIC A:  Coma / obtunded P:   Continue MSO4 gtt for comfort   Good discussion with patient's family at bedside this am 3/4. Their Doristine Bosworth was present as well. They have agreed that a transition to comfort care would be most appropriate for Cody [name]. Now on a low dose morphine gtt. Looking into options for [facility].    Baltazar Apo, MD, PhD 05/05/2013, 11:11 AM [place] [facility] [phone] or if no answer [phone]

## 2013-05-05 NOTE — Progress Notes (Signed)
CSW continuing to follow for residential hospice placement.  CSW received notification from [facility] that facility will notify weekend CSW if facility has bed become available during the weekend.   Pt referral remains with [facility], but at this time [facility] does not anticipate to have bed available during weekend.   Weekend CSW to follow up with residential hospice facilities regarding bed availability and continue to update family.  CSW to continue to follow for residential hospice placement.  [name], MSW, [place] Work [phone]

## 2013-05-05 NOTE — Progress Notes (Addendum)
CSW received notification from Cody [name] that Cody [name] spoke with MD and MD feels pt is appropriate for residential Cody and MD discussed with family on 3/5, but did not consult CSW at that time.  CSW visited pt room. No family present at bedside.  CSW contacted [name], Cody [name] via telephone and left voice message.  CSW contacted pt wife who reported that she will be at [name] between 10:30 am and 11:00 am and eager to meet with CSW regarding residential Cody.   CSW to meet with pt wife when pt wife arrives to [name] and await return phone call from [name] in order to initiate residential Cody referrals.  Addendum 10:14 am:  CSW received return phone call from pt Cody [name], Cody [name]). CSW discussed with HCPOA that MD assessment yesterday indicated that pt would be stable to explore residential Cody placement and MD planned to continue to assess pt stability for transfer to residential Cody, but MD felt it was appropriate for CSW to explore options with pt family.  CSW discussed residential Cody options with [name] and [name] chooses Cody [name].  CSW explained that if Cody [name] is full and no anticipation that a bed will become available then pt family will need to choose second option. Per [name], Cody [name] would be second choice if necessary.   CSW made referral to Cody [name], Cody [name]. Awaiting response about bed availability and CSW will make referral to Cody [name] if necessary.  CSW to continue to follow for residential Cody placement.  Addendum 1:00 pm:  CSW received notification from Cody [name], Cody [name] that facility does not have a bed available today and not bed availability anticipated during weekend.   CSW contacted [name], Cody [name] to discuss. CSW notified [name] that [name] does not have bed availability and does not not anticipate bed availability and explored secondary options with pt  HCPOA. CSW discussed options of returning to Cody [name] with Cody vs. Referrals to other residential Cody facilities.  [name] agreeable to referral to Cody [name].   CSW made referral to Cody [name]. Facility plans to process referral to determine pt appropriateness for residential Cody.   CSW updated [name], Cody [name] via telephone.  CSW received notification from RN that pt wife had arrive to [name]. CSW met with pt wife at bedside and pt sister-in-law. CSW provided emotional support to pt wife as she discussed pt recent decline. CSW updated pt wife on conversations with [name], Cody [name] and pt wife expressed understanding.  CSW will await response from Cody [name] regarding pt appropriateness and bed availability.   Cody [name], MSW, [place] Work [phone]

## 2013-05-06 NOTE — Progress Notes (Signed)
Per Nunzio Cory at [facility], no bed availability today.  CSW to contact [facility] tomorrow to inquire about availability.  Nunzio Cory will contact CSW if a bed becomes available today.  CSW to continue to follow.  Bernita Raisin, [place] Work [phone]

## 2013-05-06 NOTE — Progress Notes (Addendum)
Patient had period of apnea and then ceased to breathe after respiration.. No breathe sounds audible on auscultation. No apical pulse audible , No pulses palpable; pupils fixed and non-responsive to light. No blood pressure at this time. No signs of life.l

## 2013-05-06 NOTE — Progress Notes (Signed)
[facility] (HCPOA) notified of patient's expiration. Remains will be prepared with post mortem care after family visit.

## 2013-05-06 NOTE — Progress Notes (Addendum)
Dr Melvyn Novas notified that patient has no signs of life and RNs may pronounce death.

## 2013-05-06 NOTE — Progress Notes (Signed)
Name: Cody [name] MRN: [id number] DOB: Sep 28, 1927    ADMISSION DATE:  05-12-2013   REFERRING MD :  EDP PRIMARY SERVICE: PCCM  CHIEF COMPLAINT:  Sepsis / shock  BRIEF PATIENT DESCRIPTION:  80 yo NHP with known prostate cancer admitted with AMS, shock, presumed sepsis, VDRF. Limited Code status  SIGNIFICANT EVENTS / STUDIES:  3/2 ++ trop placed on heparin drip 3/3 heparin stopped for bleeding  LINES / TUBES: 3/2 ott>> 3/4 3/2 rt fem cvl>>  CULTURES: 3/3 sputum>>GPC clusters> neg  3/2 bc x 2>>GPC clusters>>staph epi  3/2 uc>>GPC clusters>>neg   ANTIBIOTICS: 3/2 vanc>> 3/4 3/2 pip-tazo>> 3/4  HISTORY OF PRESENT ILLNESS:   80 yo NHP with known prostate cancer whom family had been checking on hospice evaluation. Brought to ED with decreased MS, progressive over ~4 days. Noted to be hypotensive, lactic acid of 5,  required urgent intubation and fluid resuscitation in the emergency room.  No family were available for consultation. PCCM asked to admit.  SUBJECTIVE:  Comfortable on present rx   VITAL SIGNS: Temp:  [98.7 F (37.1 C)-103 F (39.4 C)] 103 F (39.4 C) (03/07 0609) Pulse Rate:  [59-107] 59 (03/07 0609) Resp:  [18-20] 18 (03/07 0609) BP: (111)/(65-66) 111/66 mmHg (03/07 0609) SpO2:  [90 %-100 %] 90 % (03/07 0609) HEMODYNAMICS:   VENTILATOR SETTINGS:   INTAKE / OUTPUT: Intake/Output     03/06 0701 - 03/07 0700 03/07 0701 - 03/08 0700   Total Intake(mL/kg)     Urine (mL/kg/hr) 1350 (0.8)    Total Output 1350     Net -1350          Stool Occurrence 1 x      PHYSICAL EXAMINATION: General:  Frail, wasted AAM  Pos O sign Neuro:  comatose HEENT:  PERL 4 mm, No LAN/JVD Cardiovascular:  HSR Lungs:  Decreased bs left base Abdomen:  decreasd bs soft Musculoskeletal:  intact Skin:  Cool  LABS:  CBC  Recent Labs Lab 05/02/13 0330 05/02/13 1140 05/03/13 0249  WBC 5.0 5.4 5.8  HGB 10.8* 9.7* 8.9*  HCT 34.9* 30.2* 28.6*  PLT 370 266 250    Coag's  Recent Labs Lab 05-12-2013 1335 05/03/13 0249  APTT 41* 47*  INR 1.66* 1.99*   BMET  Recent Labs Lab 05-12-13 2100 05/02/13 0330 05/03/13 0249  NA 163* 161* 155*  K 5.4* 5.4* 4.0  CL >130* 127* 120*  CO2 17* 19 22  BUN 68* 71* 59*  CREATININE 5.49* 5.48* 4.42*  GLUCOSE 113* 102* 109*   Electrolytes  Recent Labs Lab 05-12-2013 1250 12-May-2013 1335  May 12, 2013 2100 05/02/13 0330 05/03/13 0249  CALCIUM 7.8*  --   < > 7.1* 7.3* 7.3*  MG  --  2.2  --   --   --  1.8  PHOS  --  1.9*  --   --   --  2.4  < > = values in this interval not displayed. Sepsis Markers  Recent Labs Lab 2013-05-12 1227 05/12/13 1253 May 12, 2013 1335  LATICACIDVEN 7.53* 5.12* 4.1*  PROCALCITON  --   --  <0.10   ABG  Recent Labs Lab 05/12/2013 1435 05/02/13 0339  PHART 7.289* 7.365  PCO2ART 40.0 31.1*  PO2ART 282.0* 101.0*   Liver Enzymes  Recent Labs Lab 05-12-13 1250  AST 244*  ALT 149*  ALKPHOS 68  BILITOT 0.6  ALBUMIN 2.2*   Cardiac Enzymes  Recent Labs Lab May 12, 2013 1717 05-12-2013 2100 05/02/13 0330  TROPONINI >20.00* >  20.00* >20.00*   Glucose  Recent Labs Lab 05/02/13 1304 05/02/13 1607 05/02/13 1925 05/02/13 2334 05/03/13 0328 05/03/13 0734  GLUCAP 77 80 83 71 104* 109*    Imaging No results found.   ASSESSMENT / PLAN:  PULMONARY A: VDRF from sepsis P:   Extubated for comfort scopolamine for secretion management   CARDIOVASCULAR A: Shock, presumed septic shock but +++ trop I CHF NSTEMI (presumed stress due to infxn) P:  Comfort measures  RENAL Lab Results  Component Value Date   CREATININE 4.42* 05/03/2013   CREATININE 5.48* 05/02/2013   CREATININE 5.49* 05-30-2013    Recent Labs Lab May 30, 2013 2100 05/02/13 0330 05/03/13 0249  K 5.4* 5.4* 4.0   A:  Acute on chronic oliguric renal failure Hyperkalemia  P:   Not HD candidate   HEMATOLOGIC No results found for this basename: HGB,  in the last 72 hours  A:  Prostate cancer       Bleeding complications from heparin drip P:  No evidence bleeding at this time  INFECTIOUS A:  UTI with GPC bacteremia P:   Abx stopped  ENDOCRINE A:  No   Acute issue P:   CBGs stopped  NEUROLOGIC A:  Coma / obtunded P:   Continue MSO4 gtt for comfort > discussed with nurse, no fm at bedside     Christinia Gully, MD Pul[facility] and [place] [phone] After 5:30 PM or weekends, call [phone]

## 2013-05-07 LAB — CULTURE, BLOOD (ROUTINE X 2)

## 2013-05-19 NOTE — Discharge Summary (Signed)
Physician Discharge Summary       Patient ID: NIKLAUS MAMARIL MRN: [id number] DOB/AGE: 1927-05-31 80 y.o.  Admit date: 05/24/2013 Discharge date: 23-May-2013  Discharge Diagnoses:   Septic shock presumed related to UTI/bacteremia Altered mental status c/w TME Acute respiratory failure Metastatic prostate Ca Acute renal failure     Hospital course 80 yo NHP with known prostate cancer whom family had been checking on hospice evaluation. Brought to ED with decreased MS, progressive over ~4 days. Noted to be hypotensive, lactic acid of 5, required urgent intubation and fluid resuscitation in the emergency room. No family were available for consultation. PCCM asked to admit then pt made NCB at fm request and transitioned to palliative care where he died with comfort measures in place.   Christinia Gully, MD Pulmonary and [place] [phone] After 5:30 PM or weekends, call [phone]

## 2013-05-31 NOTE — Progress Notes (Signed)
97.50 ml of morphine drip(1:1 concentration) wasted with Inda Coke RN

## 2013-05-31 DEATH — deceased

## 2014-03-19 IMAGING — CT CT HEAD W/O CM
3 of 5 series · 15 of 40 positions shown, 18 images · non-contrast
Comparison: 08/11/2012

CLINICAL DATA: Fall.

EXAM:
CT HEAD WITHOUT CONTRAST
CT CERVICAL SPINE WITHOUT CONTRAST
TECHNIQUE: Multidetector CT imaging of the head and cervical spine was
performed following the standard protocol without intravenous
contrast. Multiplanar CT image reconstructions of the cervical spine
were also generated.

[Series 3: head w/o · axial · non-contrast · 0.43mm/px · z∈[-80,-36]mm · 2 of 29 slices shown]
[im 10/29  brain]
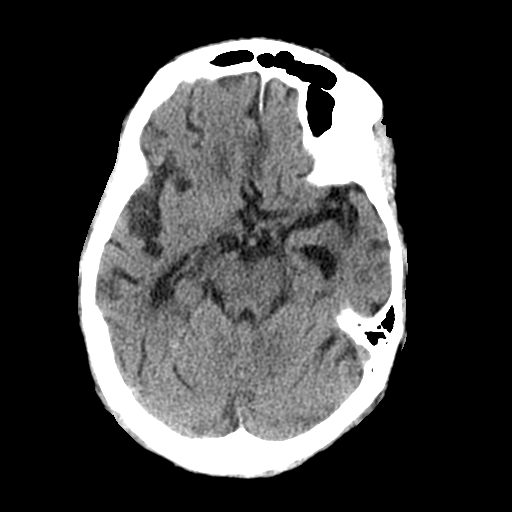
[im 19/29  brain]
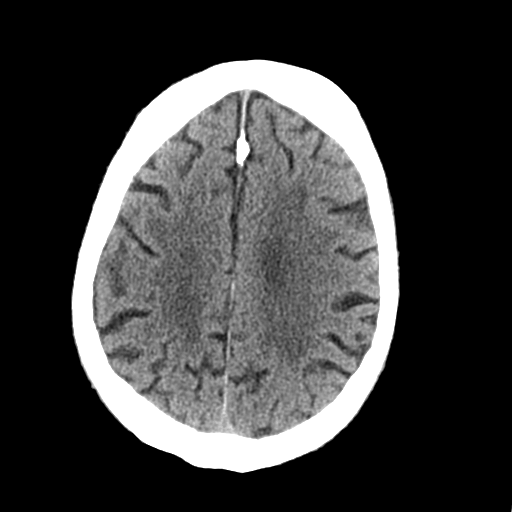

[Series 602: axial reformats · axial · 0.34mm/px · z∈[-300,-140]mm · 10 of 113 slices shown, 13 images]
[im 11/113  brain]
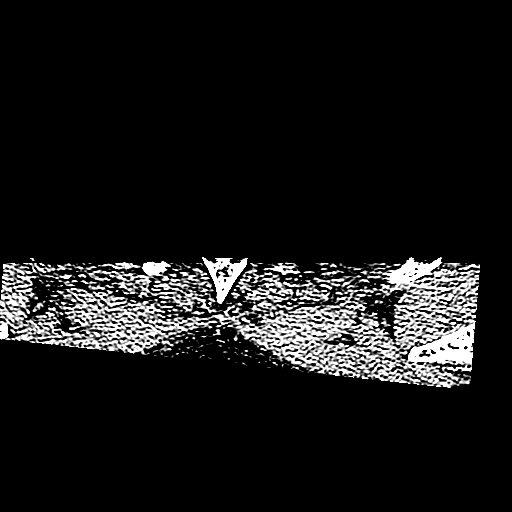
[im 11/113  bone]
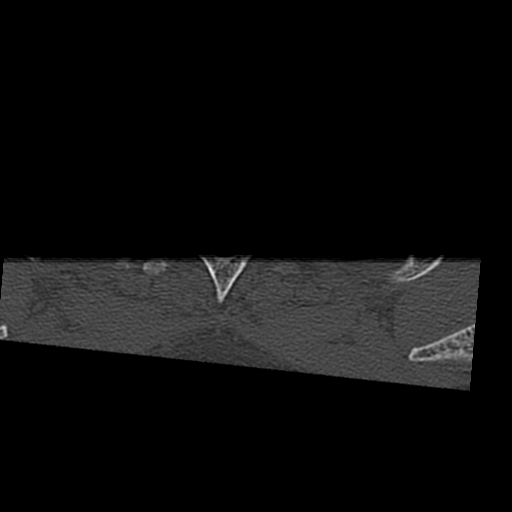
[im 21/113  brain]
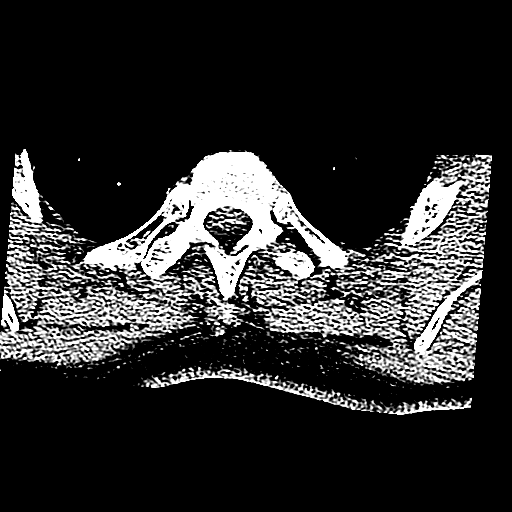
[im 31/113  brain]
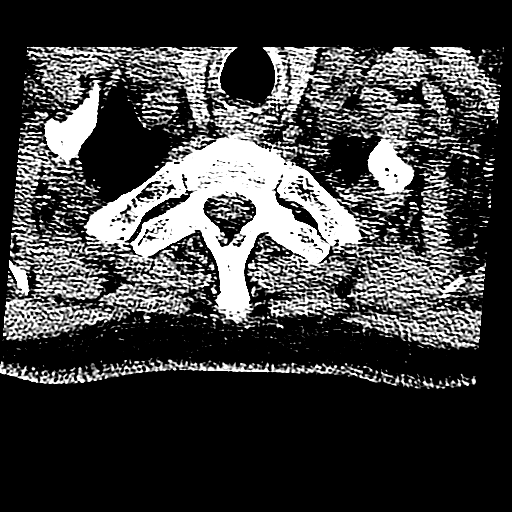
[im 41/113  brain]
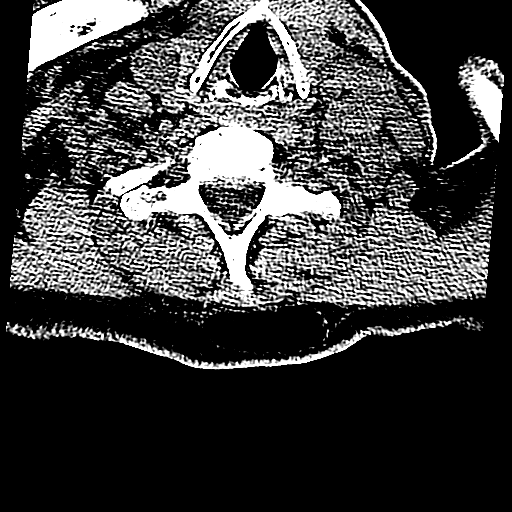
[im 51/113  brain]
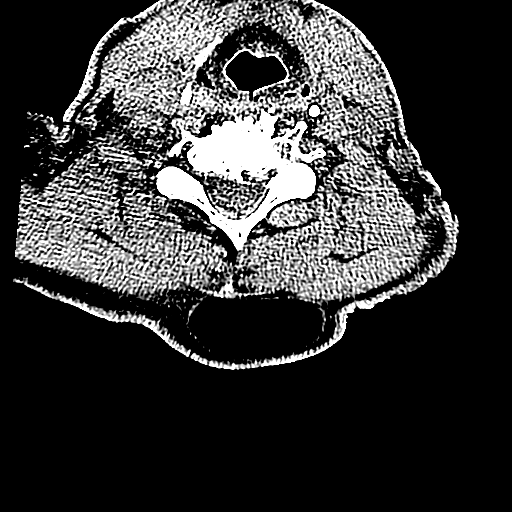
[im 51/113  bone]
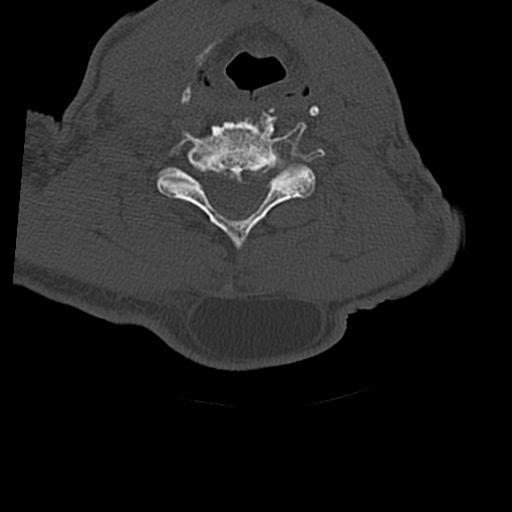
[im 62/113  brain]
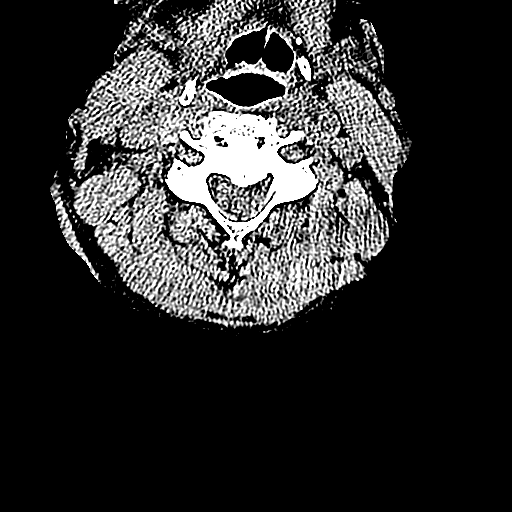
[im 72/113  brain]
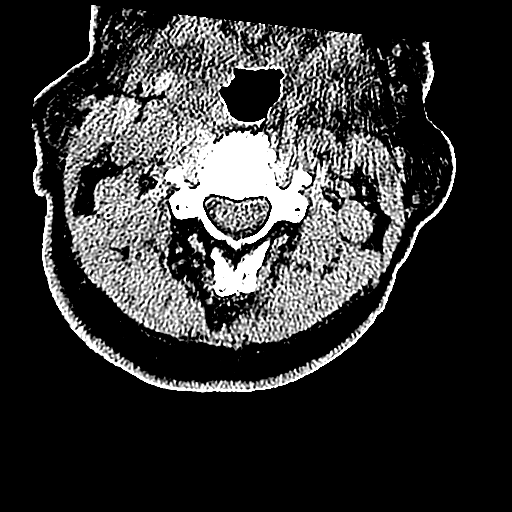
[im 82/113  brain]
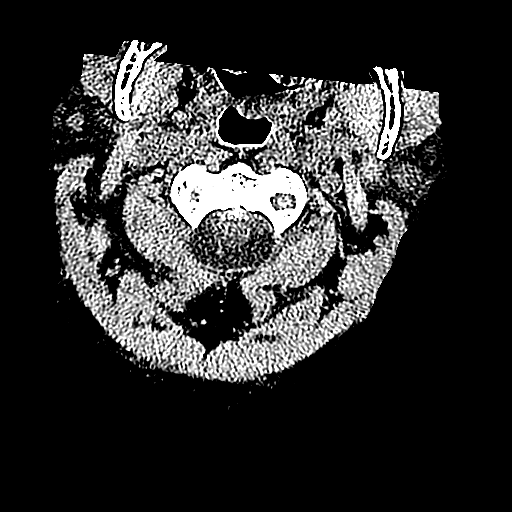
[im 92/113  brain]
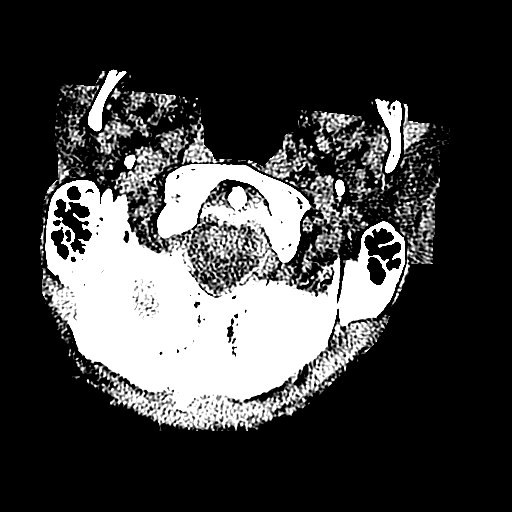
[im 92/113  bone]
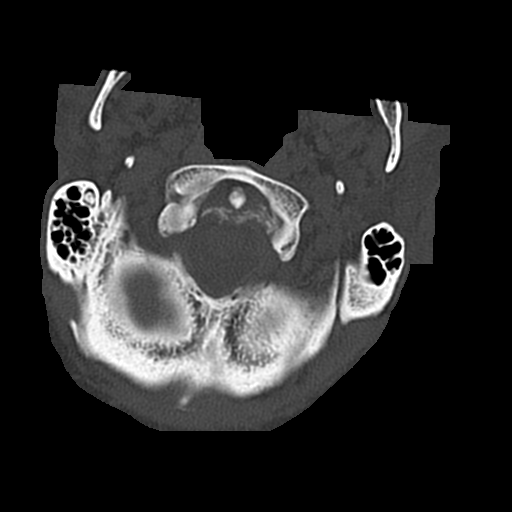
[im 102/113  brain]
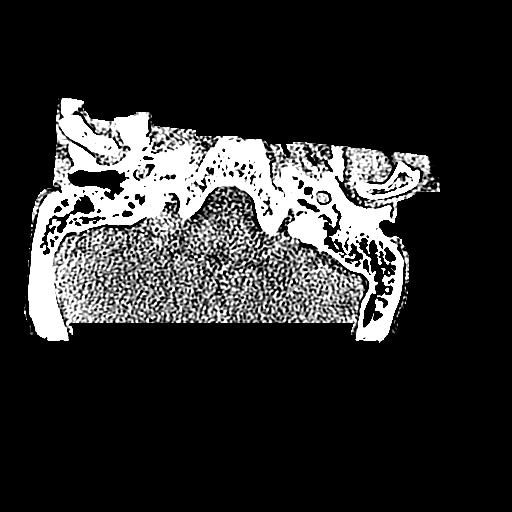

[Series 603: coronal images · coronal · 0.34mm/px · 3 of 55 slices shown]
[im 19/55  brain]
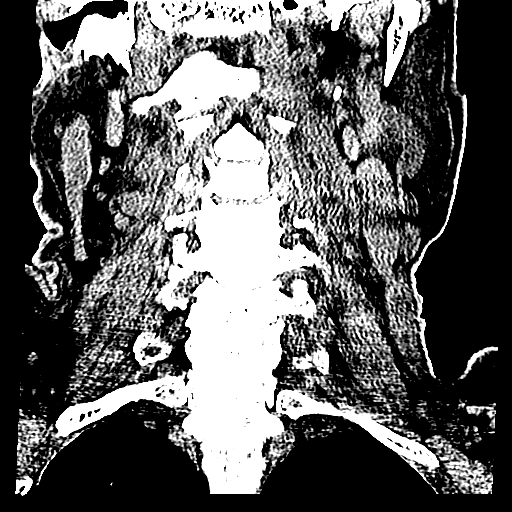
[im 25/55  brain]
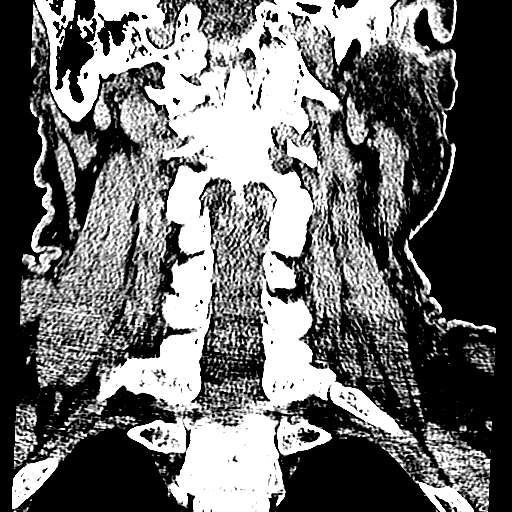
[im 31/55  brain]
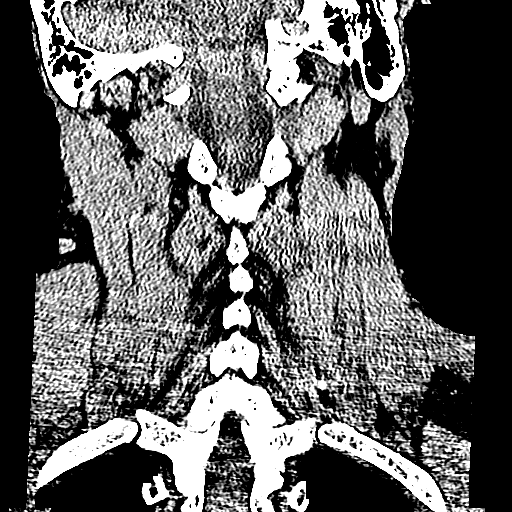

[15 of 40 positions shown; findings below may reference images not displayed]

FINDINGS: CT HEAD FINDINGS

There is atrophy and chronic small vessel disease changes. No acute
intracranial abnormality. Specifically, no hemorrhage,
hydrocephalus, mass lesion, acute infarction, or significant
intracranial injury. No acute calvarial abnormality. Visualized
paranasal sinuses and mastoids clear. Orbital soft tissues
unremarkable.

CT CERVICAL SPINE FINDINGS

Severe diffuse degenerative disc disease. Moderate degenerative
facet disease. No fracture. No epidural or paraspinal hematoma.
Normal alignment. Prevertebral soft tissues are normal. Large
posterior subcutaneous soft tissue lipoma.
IMPRESSION: CT HEAD IMPRESSION

No acute intracranial abnormality.

Atrophy, chronic microvascular disease.

CT CERVICAL SPINE IMPRESSION

Advanced degenerative changes. No acute findings.

## 2014-10-02 IMAGING — CR DG CHEST 1V PORT
1 series · 1 of 1 positions shown · non-contrast
Comparison: 05/02/2013

CLINICAL DATA: Endotracheal tube position

EXAM:
PORTABLE CHEST - 1 VIEW

[AP]
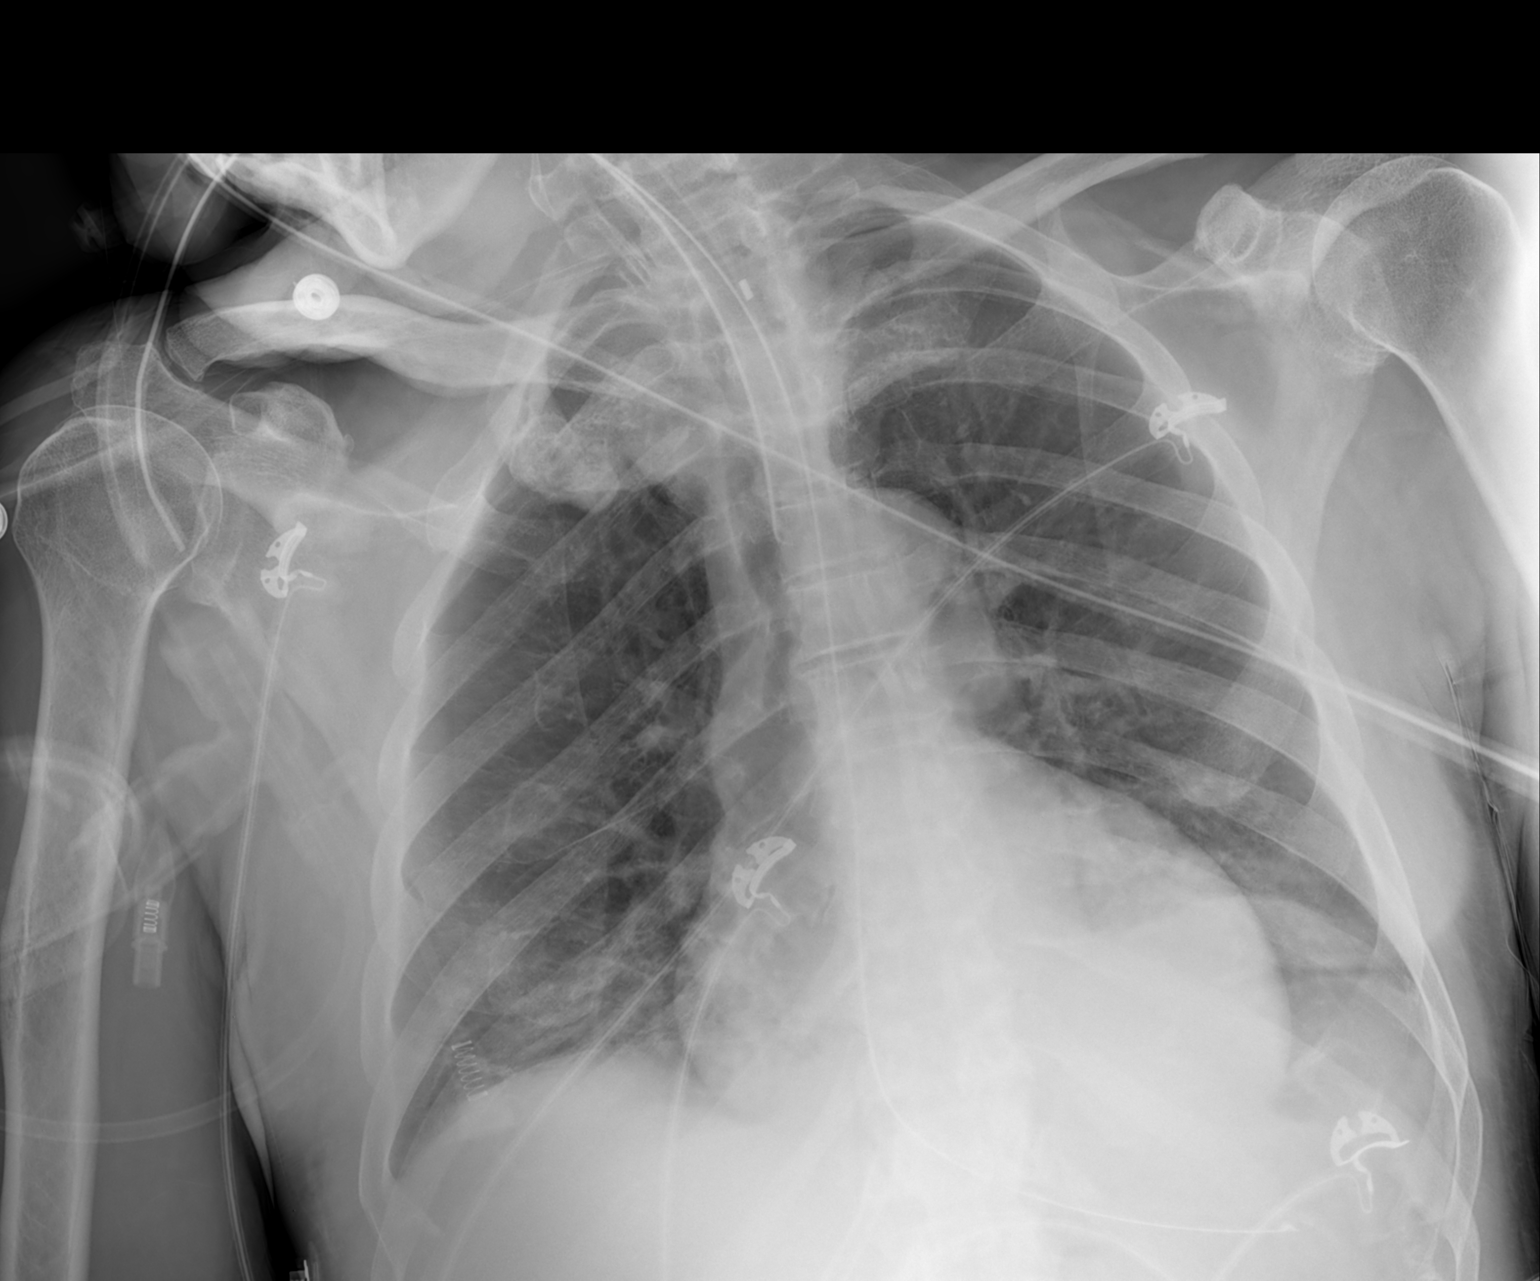

[1 of 1 positions shown; findings below may reference images not displayed]

FINDINGS: Endotracheal tube ends in the mid thoracic trachea. Orogastric tube
enters the stomach. Coiling of the orogastric tube overlapping the
head/neck is likely external to the patient given the course of the
tubing.

Indistinct basilar opacities, favoring atelectasis. No effusion or
pneumothorax. Normal heart size.
IMPRESSION: 1. Unchanged positioning of endotracheal and orogastric tubes, as
above.
2. Unchanged bibasilar atelectasis.
